# Patient Record
Sex: Female | Born: 1995 | Race: Black or African American | Hispanic: No | Smoking: Never smoker
Health system: Southern US, Community
[De-identification: ages and names within clinical notes are randomized; demographics above are authoritative.]

## PROBLEM LIST (undated history)

## (undated) ENCOUNTER — Inpatient Hospital Stay (HOSPITAL_COMMUNITY): Payer: Self-pay

## (undated) ENCOUNTER — Emergency Department (HOSPITAL_COMMUNITY): Admission: EM

## (undated) DIAGNOSIS — F32A Depression, unspecified: Secondary | ICD-10-CM

## (undated) DIAGNOSIS — F419 Anxiety disorder, unspecified: Secondary | ICD-10-CM

## (undated) DIAGNOSIS — Z8619 Personal history of other infectious and parasitic diseases: Secondary | ICD-10-CM

## (undated) DIAGNOSIS — Z789 Other specified health status: Secondary | ICD-10-CM

## (undated) DIAGNOSIS — D219 Benign neoplasm of connective and other soft tissue, unspecified: Secondary | ICD-10-CM

## (undated) HISTORY — DX: Personal history of other infectious and parasitic diseases: Z86.19

## (undated) HISTORY — PX: WISDOM TOOTH EXTRACTION: SHX21

## (undated) HISTORY — PX: TONSILLECTOMY: SUR1361

---
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
## 2013-02-27 IMAGING — CT CT ABD-PELV W/ CM
2 of 4 series · 17 of 46 positions shown, 19 images · IV contrast (agent unspecified)
Comparison: None.

CLINICAL DATA: Epigastric pain.  Nausea and diarrhea.  Fever.
Dysuria.

CT ABDOMEN AND PELVIS WITH CONTRAST
TECHNIQUE: Multidetector CT imaging of the abdomen and pelvis was
performed following the standard protocol during bolus
administration of intravenous contrast.
Contrast:  120 ml 3mnipaque-ZAA

[Series 2: abd/pelv with 5.0 b31f st · axial · 0.91mm/px · z∈[-451,+9]mm · 14 of 102 slices shown, 16 images]
[im 5/102  soft-tissue]
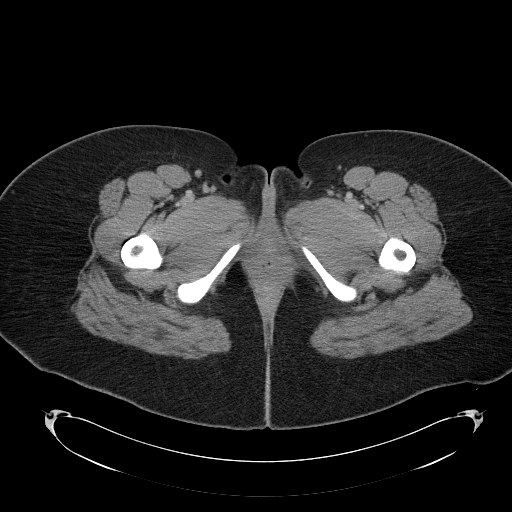
[im 5/102  bone]
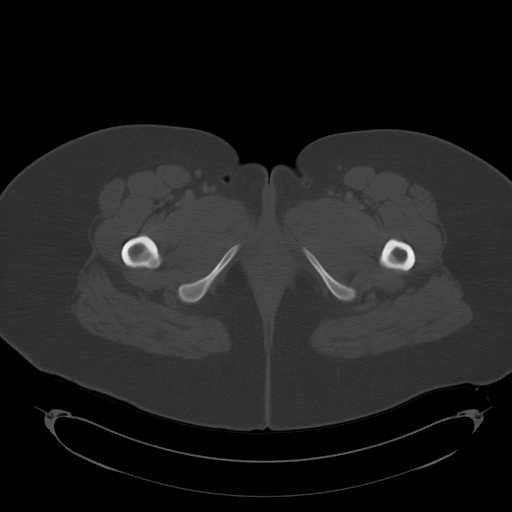
[im 13/102  soft-tissue]
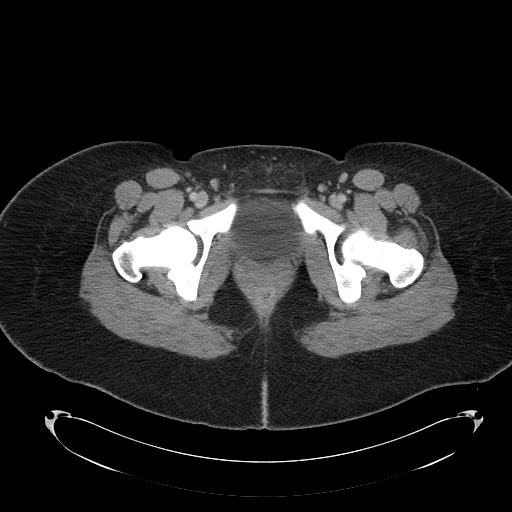
[im 22/102  soft-tissue]
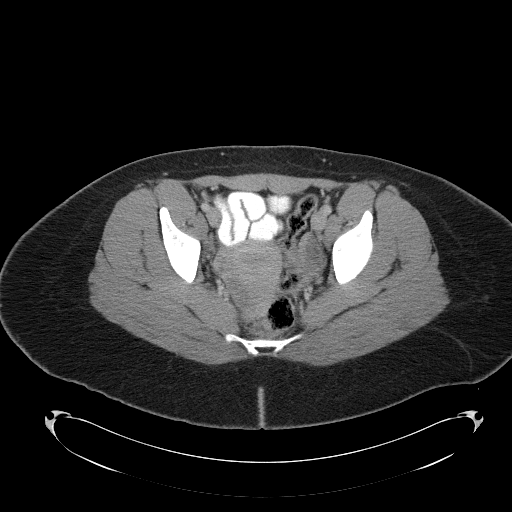
[im 26/102  soft-tissue]
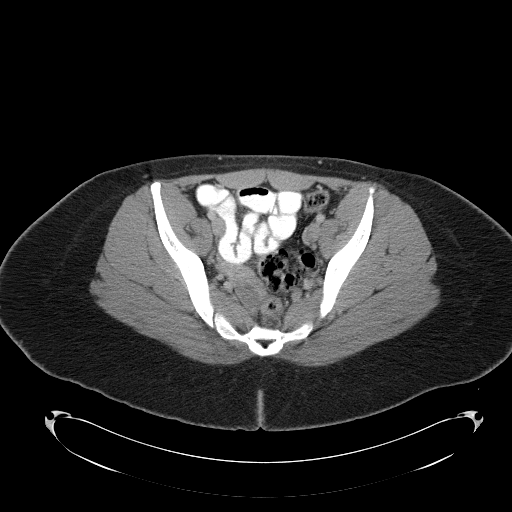
[im 34/102  soft-tissue]
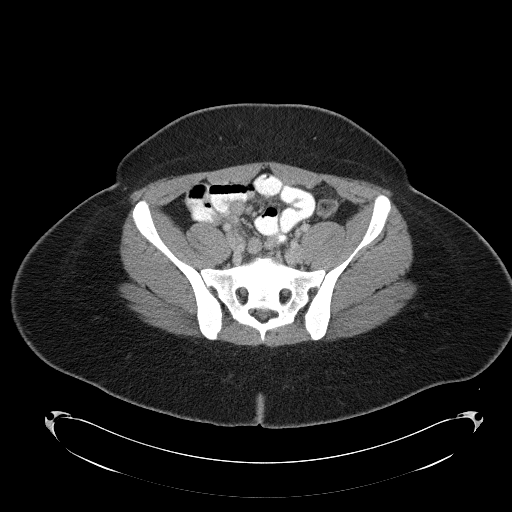
[im 43/102  soft-tissue]
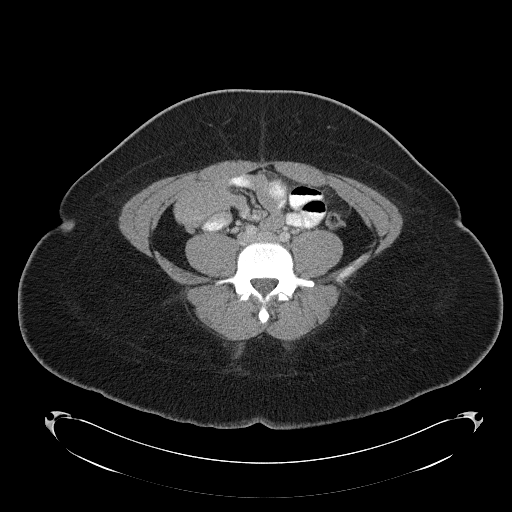
[im 47/102  soft-tissue]
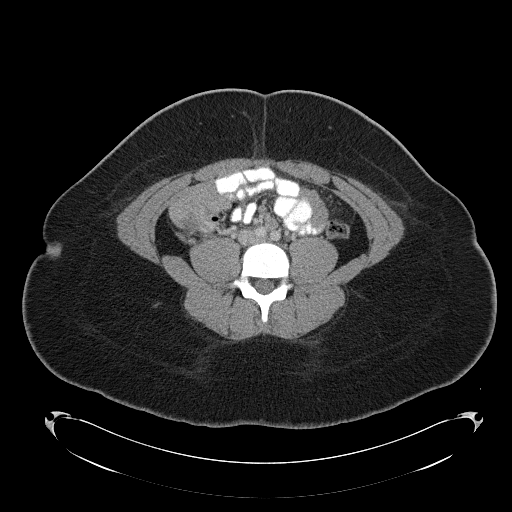
[im 55/102  soft-tissue]
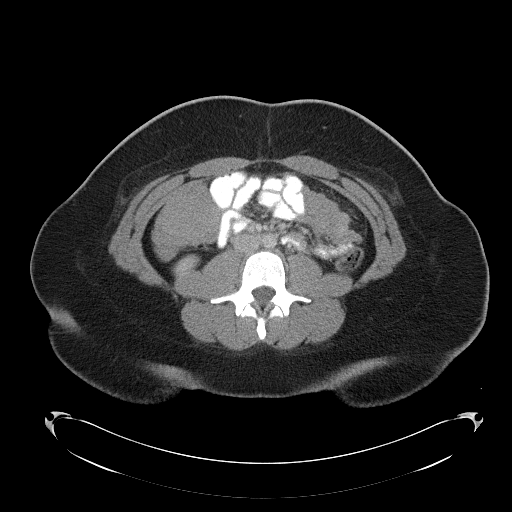
[im 59/102  soft-tissue]
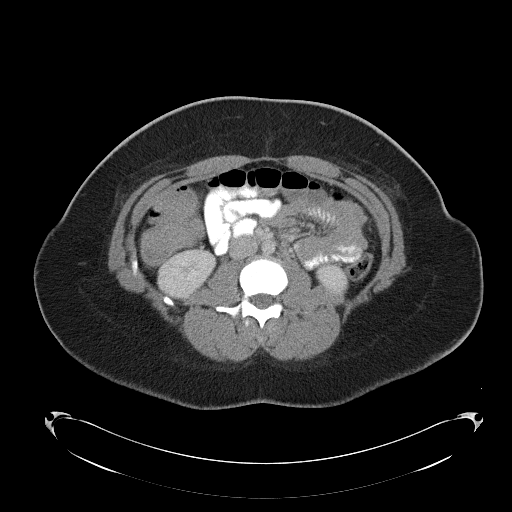
[im 59/102  bone]
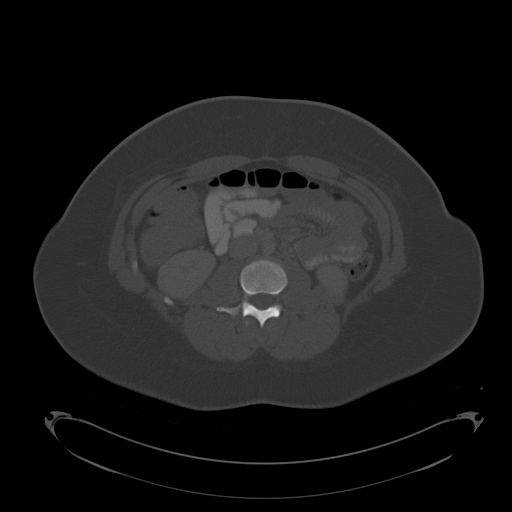
[im 68/102  soft-tissue]
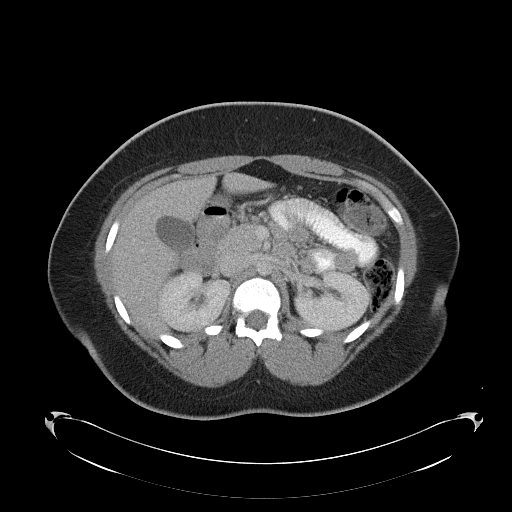
[im 76/102  soft-tissue]
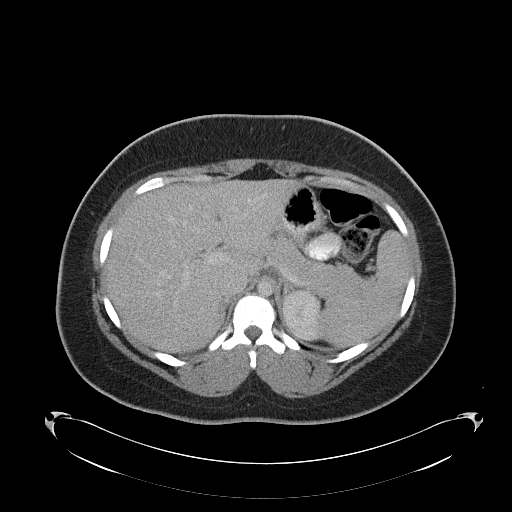
[im 80/102  soft-tissue]
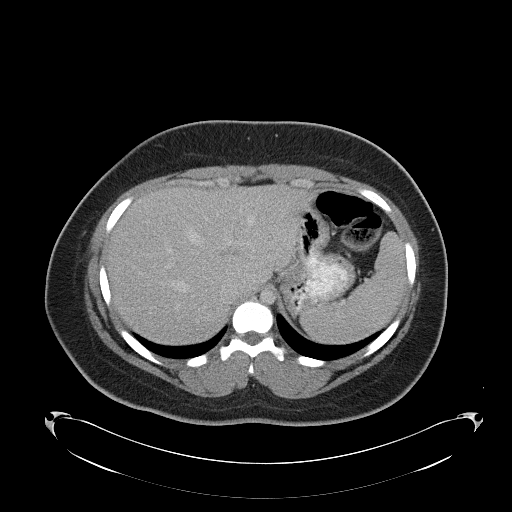
[im 89/102  soft-tissue]
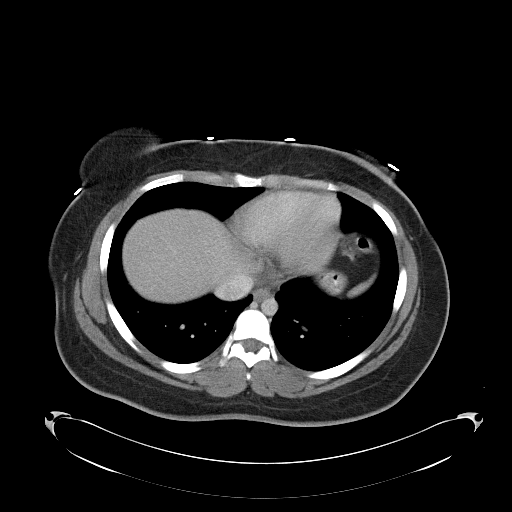
[im 97/102  soft-tissue]
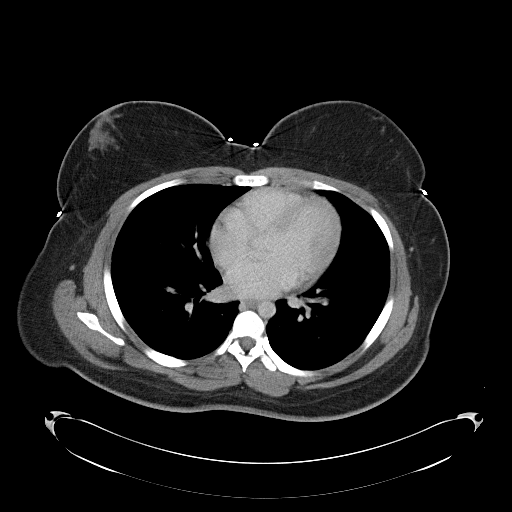

[Series 5: coronals · coronal · 0.99mm/px · 3 of 104 slices shown]
[im 35/104  soft-tissue]
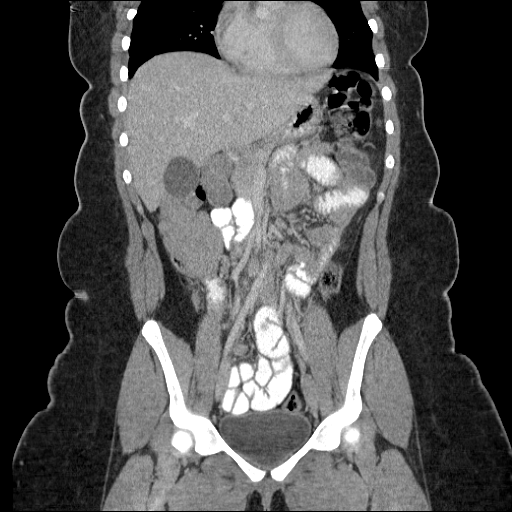
[im 46/104  soft-tissue]
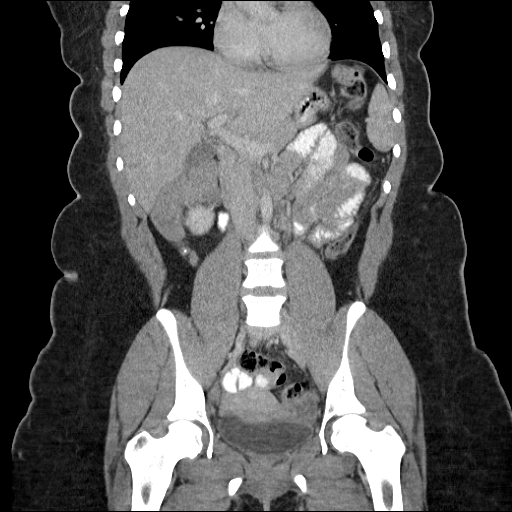
[im 58/104  soft-tissue]
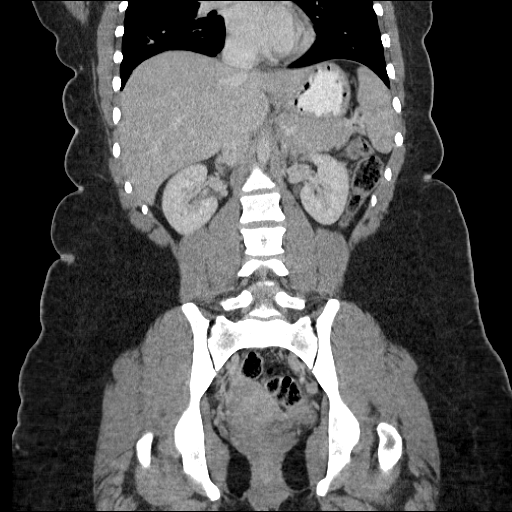

[17 of 46 positions shown; findings below may reference images not displayed]

FINDINGS: The liver, spleen, pancreas, and adrenal glands appear
unremarkable.

The gallbladder and biliary system appear unremarkable.

There is loops of bowel are not opacified by oral contrast and thus
difficult to assess.

A tiny appendicolith is present in the appendix, without compelling
findings of the appendiceal inflammation, and with appendiceal
diameter up to 0.7 cm.  Small scattered pericecal lymph nodes are
present.

The kidneys appear unremarkable, as do the proximal ureters.

No urinary bladder wall thickening is observed. The uterus and
adnexa appear unremarkable.

No free pelvic fluid identified.
IMPRESSION: 1.  Tiny appendicolith, but without compelling findings of acute
appendicitis.
2.  No dilated bowel noted.  No additional significant CT
abnormalities are observed.

## 2013-06-16 ENCOUNTER — Ambulatory Visit: Payer: Medicaid Other | Admitting: Obstetrics

## 2013-09-22 ENCOUNTER — Ambulatory Visit: Payer: Medicaid Other | Admitting: Obstetrics

## 2013-09-23 ENCOUNTER — Ambulatory Visit: Payer: Medicaid Other | Admitting: Obstetrics

## 2013-10-22 ENCOUNTER — Encounter: Payer: Self-pay | Admitting: Obstetrics

## 2013-10-22 ENCOUNTER — Ambulatory Visit (INDEPENDENT_AMBULATORY_CARE_PROVIDER_SITE_OTHER): Payer: Medicaid Other | Admitting: Obstetrics

## 2013-10-22 ENCOUNTER — Other Ambulatory Visit: Payer: Self-pay | Admitting: Obstetrics

## 2013-10-22 VITALS — BP 128/87 | HR 90 | Temp 98.3°F | Ht 64.0 in | Wt 270.0 lb

## 2013-10-22 DIAGNOSIS — Z0000 Encounter for general adult medical examination without abnormal findings: Secondary | ICD-10-CM

## 2013-10-22 DIAGNOSIS — Z0289 Encounter for other administrative examinations: Secondary | ICD-10-CM

## 2013-10-22 NOTE — Progress Notes (Signed)
Subjective:     Angela Hammond is a 18 y.o. female here for a routine exam.  Current complaints: none.    Personal health questionnaire:  Is patient Ashkenazi Jewish, have a family history of breast and/or ovarian cancer: no Is there a family history of uterine cancer diagnosed at age < 82, gastrointestinal cancer, urinary tract cancer, family member who is a Personnel officer syndrome-associated carrier: no Is the patient overweight and hypertensive, family history of diabetes, personal history of gestational diabetes or PCOS: no Is patient over 106, have PCOS,  family history of premature CHD under age 69, diabetes, smoke, have hypertension or peripheral artery disease:  no At any time, has a partner hit, kicked or otherwise hurt or frightened you?: no Over the past 2 weeks, have you felt down, depressed or hopeless?: no Over the past 2 weeks, have you felt little interest or pleasure in doing things?:no   Gynecologic History Patient's last menstrual period was 10/10/2013. Contraception: condoms Last Pap: 2010. Results were: normal Last mammogram: n/a. Results were: n/a  Obstetric History OB History  Gravida Para Term Preterm AB SAB TAB Ectopic Multiple Living  0 0 0 0 0 0 0 0 0 0         Past Medical History  Diagnosis Date  . History of gonorrhea     2013    Past Surgical History  Procedure Laterality Date  . Tonsillectomy      AGE 88    Current outpatient prescriptions:guaiFENesin (ROBITUSSIN) 100 MG/5ML liquid, Take 200 mg by mouth daily as needed for cough., Disp: , Rfl: ;  ibuprofen (ADVIL,MOTRIN) 200 MG tablet, Take 400 mg by mouth every 6 (six) hours as needed. For pain, Disp: , Rfl: ;  phenol (CHLORASEPTIC) 1.4 % LIQD, Use as directed 2 sprays in the mouth or throat daily as needed (for sore throat)., Disp: , Rfl:  No Known Allergies  History  Substance Use Topics  . Smoking status: Current Some Day Smoker  . Smokeless tobacco: Never Used  . Alcohol Use: Yes     Comment:  occasionally     Family History  Problem Relation Age of Onset  . Stroke Mother   . Hypertension Mother   . Hypertension Father   . Diabetes Maternal Aunt   . Stroke Maternal Aunt   . Hypertension Maternal Aunt   . Stroke Maternal Grandfather   . Hypertension Maternal Grandfather       Review of Systems  Constitutional: negative for fatigue and weight loss Respiratory: negative for cough and wheezing Cardiovascular: negative for chest pain, fatigue and palpitations Gastrointestinal: negative for abdominal pain and change in bowel habits Musculoskeletal:negative for myalgias Neurological: negative for gait problems and tremors Behavioral/Psych: negative for abusive relationship, depression Endocrine: negative for temperature intolerance   Genitourinary:negative for abnormal menstrual periods, genital lesions, hot flashes, sexual problems and vaginal discharge Integument/breast: negative for breast lump, breast tenderness, nipple discharge and skin lesion(s)    Objective:       BP 128/87  Pulse 90  Temp(Src) 98.3 F (36.8 C)  Ht 5\' 4"  (1.626 m)  Wt 270 lb (122.471 kg)  BMI 46.32 kg/m2  LMP 10/10/2013 General:   alert  Skin:   no rash or abnormalities  Lungs:   clear to auscultation bilaterally  Heart:   regular rate and rhythm, S1, S2 normal, no murmur, click, rub or gallop  Breasts:   normal without suspicious masses, skin or nipple changes or axillary nodes  Abdomen:  normal  findings: no organomegaly, soft, non-tender and no hernia  Pelvis:  External genitalia: normal general appearance Urinary system: urethral meatus normal and bladder without fullness, nontender Vaginal: normal without tenderness, induration or masses Cervix: normal appearance Adnexa: normal bimanual exam Uterus: anteverted and non-tender, normal size   Lab Review Urine pregnancy test Labs reviewed yes Radiologic studies reviewed no    Assessment:    Healthy female exam.    Plan:     Education reviewed: low fat, low cholesterol diet, safe sex/STD prevention, self breast exams, smoking cessation and weight bearing exercise. Contraception: condoms. Follow up in: 1 year.   No orders of the defined types were placed in this encounter.   Orders Placed This Encounter  Procedures  . WET PREP BY MOLECULAR PROBE  . GC/Chlamydia Probe Amp  . HIV antibody (with reflex)

## 2013-10-23 LAB — WET PREP BY MOLECULAR PROBE
CANDIDA SPECIES: NEGATIVE
Gardnerella vaginalis: POSITIVE — AB
TRICHOMONAS VAG: NEGATIVE

## 2013-10-23 LAB — GC/CHLAMYDIA PROBE AMP
CT PROBE, AMP APTIMA: NEGATIVE
GC Probe RNA: NEGATIVE

## 2013-10-24 ENCOUNTER — Other Ambulatory Visit: Payer: Self-pay | Admitting: Obstetrics

## 2013-10-24 DIAGNOSIS — B9689 Other specified bacterial agents as the cause of diseases classified elsewhere: Secondary | ICD-10-CM

## 2013-10-24 DIAGNOSIS — N760 Acute vaginitis: Principal | ICD-10-CM

## 2013-10-24 MED ORDER — METRONIDAZOLE 500 MG PO TABS
500.0000 mg | ORAL_TABLET | Freq: Two times a day (BID) | ORAL | Status: DC
Start: 2013-10-24 — End: 2014-10-08

## 2013-10-30 ENCOUNTER — Telehealth: Payer: Self-pay | Admitting: *Deleted

## 2013-10-30 NOTE — Telephone Encounter (Signed)
Pt called for lab results. Return call to pt.  Pt made aware of lab results and Rx has already been sent to pharmacy.  Pt advised of factors that contribute to BV and explained medication.  Pt advised to contact office with any other conerns.

## 2013-12-23 ENCOUNTER — Telehealth: Payer: Self-pay | Admitting: *Deleted

## 2013-12-23 NOTE — Telephone Encounter (Signed)
Patient called regarding her cycle- did not leave any identifiers except her name/#. Patient states she has a normal 4 day cycle-but she has been bleeding for 14 days. 12/23/2013 3:21 LM on VM to CB

## 2013-12-30 NOTE — Telephone Encounter (Signed)
Call to patient- she was out of town at the time and she went to a hospital in [place]. Patient was given medication that seemed to help. Patient advised to call the office for appointment if her bleeding goes off schedule again.

## 2014-10-08 ENCOUNTER — Ambulatory Visit (INDEPENDENT_AMBULATORY_CARE_PROVIDER_SITE_OTHER): Payer: Medicaid Other | Admitting: Obstetrics

## 2014-10-08 VITALS — BP 113/79 | HR 84 | Temp 98.2°F | Wt 281.0 lb

## 2014-10-08 DIAGNOSIS — N898 Other specified noninflammatory disorders of vagina: Secondary | ICD-10-CM

## 2014-10-08 DIAGNOSIS — Z113 Encounter for screening for infections with a predominantly sexual mode of transmission: Secondary | ICD-10-CM

## 2014-10-08 LAB — CBC
HEMATOCRIT: 38.9 % (ref 36.0–46.0)
Hemoglobin: 12 g/dL (ref 12.0–15.0)
MCH: 23.9 pg — ABNORMAL LOW (ref 26.0–34.0)
MCHC: 30.8 g/dL (ref 30.0–36.0)
MCV: 77.5 fL — ABNORMAL LOW (ref 78.0–100.0)
MPV: 10.5 fL (ref 8.6–12.4)
PLATELETS: 246 10*3/uL (ref 150–400)
RBC: 5.02 MIL/uL (ref 3.87–5.11)
RDW: 17.6 % — AB (ref 11.5–15.5)
WBC: 5.4 10*3/uL (ref 4.0–10.5)

## 2014-10-08 LAB — COMPREHENSIVE METABOLIC PANEL
ALK PHOS: 62 U/L (ref 33–115)
ALT: 11 U/L (ref 6–29)
AST: 16 U/L (ref 10–30)
Albumin: 4 g/dL (ref 3.6–5.1)
BUN: 10 mg/dL (ref 7–25)
CALCIUM: 8.8 mg/dL (ref 8.6–10.2)
CO2: 24 mmol/L (ref 20–31)
Chloride: 105 mmol/L (ref 98–110)
Creat: 0.93 mg/dL (ref 0.50–1.10)
Glucose, Bld: 94 mg/dL (ref 65–99)
Potassium: 4 mmol/L (ref 3.5–5.3)
Sodium: 138 mmol/L (ref 135–146)
Total Bilirubin: 0.7 mg/dL (ref 0.2–1.2)
Total Protein: 7.3 g/dL (ref 6.1–8.1)

## 2014-10-08 LAB — CHOLESTEROL, TOTAL: CHOLESTEROL: 151 mg/dL (ref 125–170)

## 2014-10-08 LAB — HDL CHOLESTEROL: HDL: 33 mg/dL — AB (ref >=46)

## 2014-10-08 LAB — TRIGLYCERIDES: TRIGLYCERIDES: 102 mg/dL (ref <150)

## 2014-10-08 LAB — LDL CHOLESTEROL, DIRECT: Direct LDL: 112 mg/dL — ABNORMAL HIGH (ref <110)
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
## 2014-10-09 LAB — TSH: TSH: 1.774 u[IU]/mL (ref 0.350–4.500)

## 2014-10-09 LAB — HEPATITIS C ANTIBODY: HCV AB: NEGATIVE

## 2014-10-09 LAB — HEPATITIS B SURFACE ANTIGEN: Hepatitis B Surface Ag: NEGATIVE

## 2014-10-09 LAB — RPR

## 2014-10-09 LAB — HEMOGLOBIN A1C
HEMOGLOBIN A1C: 5.8 % — AB (ref <5.7)
MEAN PLASMA GLUCOSE: 120 mg/dL — AB (ref <117)

## 2014-10-09 LAB — HIV ANTIBODY (ROUTINE TESTING W REFLEX): HIV 1&2 Ab, 4th Generation: NONREACTIVE

## 2014-10-13 LAB — SURESWAB, VAGINOSIS/VAGINITIS PLUS
Atopobium vaginae: 7.5 Log (cells/mL)
C. GLABRATA, DNA: NOT DETECTED
C. TRACHOMATIS RNA, TMA: NOT DETECTED
C. albicans, DNA: NOT DETECTED
C. parapsilosis, DNA: NOT DETECTED
C. tropicalis, DNA: NOT DETECTED
LACTOBACILLUS SPECIES: NOT DETECTED Log (cells/mL)
MEGASPHAERA SPECIES: >8 Log (cells/mL)
N. gonorrhoeae RNA, TMA: NOT DETECTED
T. VAGINALIS RNA, QL TMA: NOT DETECTED

## 2014-10-14 ENCOUNTER — Other Ambulatory Visit: Payer: Self-pay | Admitting: Obstetrics

## 2014-10-14 DIAGNOSIS — N760 Acute vaginitis: Principal | ICD-10-CM

## 2014-10-14 DIAGNOSIS — B9689 Other specified bacterial agents as the cause of diseases classified elsewhere: Secondary | ICD-10-CM

## 2014-10-14 MED ORDER — METRONIDAZOLE 500 MG PO TABS: 500.0000 mg | ORAL_TABLET | Freq: Two times a day (BID) | ORAL | Status: DC

## 2014-10-15 ENCOUNTER — Other Ambulatory Visit: Payer: Self-pay | Admitting: *Deleted

## 2014-10-15 DIAGNOSIS — N760 Acute vaginitis: Principal | ICD-10-CM

## 2014-10-15 DIAGNOSIS — B9689 Other specified bacterial agents as the cause of diseases classified elsewhere: Secondary | ICD-10-CM

## 2014-10-15 MED ORDER — METRONIDAZOLE 500 MG PO TABS: 500.0000 mg | ORAL_TABLET | Freq: Two times a day (BID) | ORAL | Status: DC

## 2014-10-18 ENCOUNTER — Telehealth: Payer: Self-pay | Admitting: *Deleted

## 2014-10-18 NOTE — Telephone Encounter (Signed)
Patient states her medication was not covered by her insurance in [place]. 6:00 LM on VM to CB- can get medication when she gets back if she wants.

## 2014-10-19 ENCOUNTER — Other Ambulatory Visit: Payer: Self-pay | Admitting: *Deleted

## 2014-10-19 DIAGNOSIS — N760 Acute vaginitis: Principal | ICD-10-CM

## 2014-10-19 DIAGNOSIS — B9689 Other specified bacterial agents as the cause of diseases classified elsewhere: Secondary | ICD-10-CM

## 2014-10-19 MED ORDER — METRONIDAZOLE 500 MG PO TABS: 500.0000 mg | ORAL_TABLET | Freq: Two times a day (BID) | ORAL | Status: DC

## 2014-10-25 ENCOUNTER — Ambulatory Visit: Payer: Medicaid Other | Admitting: Obstetrics

## 2014-10-29 ENCOUNTER — Telehealth: Payer: Self-pay | Admitting: *Deleted

## 2014-10-29 MED ORDER — METRONIDAZOLE 0.75 % VA GEL: 1.0000 | Freq: Two times a day (BID) | VAGINAL | Status: DC

## 2014-10-29 NOTE — Telephone Encounter (Signed)
Patient contacted the office stating she is unable to take the medication prescribed for her. Patient states it is "too strong and taste nasty." Patient is requesting a different medication. Patient was given Metronidazole for a bacterial infection. Per Dr. Clearance CootsHarper patient can choose between Metrogel intravaginally BID for 5 days or Clindamycin 300 mg  By mouth every 8 hours # 21. Patient advised of recommendations and chose the Metrogel. Prescription sent to the pharmacy.

## 2014-12-14 ENCOUNTER — Ambulatory Visit: Payer: Medicaid Other | Admitting: Obstetrics

## 2014-12-15 ENCOUNTER — Ambulatory Visit: Payer: Medicaid Other | Admitting: Certified Nurse Midwife

## 2015-05-30 ENCOUNTER — Telehealth: Payer: Self-pay | Admitting: *Deleted

## 2015-05-30 NOTE — Telephone Encounter (Signed)
Patient has questions about her pH balance-  Discussed different ways to control pH balance. Patient discussed irregular cycles also. Will call office for appointment if needs.

## 2015-07-31 ENCOUNTER — Encounter (HOSPITAL_COMMUNITY): Payer: Self-pay

## 2015-07-31 ENCOUNTER — Emergency Department (HOSPITAL_COMMUNITY)
Admission: EM | Admit: 2015-07-31 | Discharge: 2015-07-31 | Disposition: A | Discharge status: Home / Self Care | Payer: Medicaid Other | Attending: Dermatology | Admitting: Dermatology

## 2015-07-31 DIAGNOSIS — Z5321 Procedure and treatment not carried out due to patient leaving prior to being seen by health care provider: Secondary | ICD-10-CM | POA: Insufficient documentation

## 2015-07-31 DIAGNOSIS — F17200 Nicotine dependence, unspecified, uncomplicated: Secondary | ICD-10-CM | POA: Insufficient documentation

## 2015-07-31 DIAGNOSIS — N926 Irregular menstruation, unspecified: Secondary | ICD-10-CM | POA: Insufficient documentation

## 2015-07-31 LAB — URINE MICROSCOPIC-ADD ON

## 2015-07-31 LAB — URINALYSIS, ROUTINE W REFLEX MICROSCOPIC
Bilirubin Urine: NEGATIVE
Glucose, UA: NEGATIVE mg/dL
Hgb urine dipstick: NEGATIVE
Ketones, ur: NEGATIVE mg/dL
Nitrite: NEGATIVE
PROTEIN: NEGATIVE mg/dL
SPECIFIC GRAVITY, URINE: 1.018 (ref 1.005–1.030)
pH: 6 (ref 5.0–8.0)

## 2015-07-31 NOTE — ED Notes (Signed)
Called x 3 (for room placement); no answer.  

## 2015-07-31 NOTE — ED Notes (Signed)
Patient here to be evaluated for irregular periods since April. States she has had some intermittent spotting. Denies abdominal pain on arrival

## 2015-08-01 ENCOUNTER — Encounter (HOSPITAL_COMMUNITY): Payer: Self-pay | Admitting: Emergency Medicine

## 2015-08-01 ENCOUNTER — Emergency Department (HOSPITAL_COMMUNITY)
Admission: EM | Admit: 2015-08-01 | Discharge: 2015-08-01 | Disposition: A | Discharge status: Home / Self Care | Payer: Self-pay | Attending: Dermatology | Admitting: Dermatology

## 2015-08-01 LAB — POC URINE PREG, ED
PREG TEST UR: NEGATIVE
Preg Test, Ur: NEGATIVE

## 2015-08-01 NOTE — ED Notes (Signed)
No answer in waiting room 

## 2015-08-01 NOTE — ED Notes (Signed)
Called pt x2 no answer

## 2015-08-01 NOTE — ED Notes (Signed)
No answer for vital sign recheck. 

## 2015-08-01 NOTE — ED Notes (Signed)
Pt reports last normal period was in April.  Reports intermittent spotting since then.  Denies pain.  Currently not spotting.

## 2015-11-12 ENCOUNTER — Encounter (HOSPITAL_COMMUNITY): Payer: Self-pay | Admitting: *Deleted

## 2015-11-12 ENCOUNTER — Emergency Department (HOSPITAL_COMMUNITY): Payer: Self-pay

## 2015-11-12 ENCOUNTER — Emergency Department (HOSPITAL_COMMUNITY)
Admission: EM | Admit: 2015-11-12 | Discharge: 2015-11-12 | Disposition: A | Discharge status: Home / Self Care | Payer: Self-pay | Attending: Emergency Medicine | Admitting: Emergency Medicine

## 2015-11-12 DIAGNOSIS — Y999 Unspecified external cause status: Secondary | ICD-10-CM | POA: Insufficient documentation

## 2015-11-12 DIAGNOSIS — Y9389 Activity, other specified: Secondary | ICD-10-CM | POA: Insufficient documentation

## 2015-11-12 DIAGNOSIS — W208XXA Other cause of strike by thrown, projected or falling object, initial encounter: Secondary | ICD-10-CM | POA: Insufficient documentation

## 2015-11-12 DIAGNOSIS — S60012A Contusion of left thumb without damage to nail, initial encounter: Secondary | ICD-10-CM | POA: Insufficient documentation

## 2015-11-12 DIAGNOSIS — Y929 Unspecified place or not applicable: Secondary | ICD-10-CM | POA: Insufficient documentation

## 2015-11-12 DIAGNOSIS — F17200 Nicotine dependence, unspecified, uncomplicated: Secondary | ICD-10-CM | POA: Insufficient documentation

## 2015-11-12 NOTE — Progress Notes (Signed)
Orthopedic Tech Progress Note Patient Details:  Gerhard MunchLeah C Leikam Dec 22, 1995 [id number]  Ortho Devices Type of Ortho Device: Thumb velcro splint Ortho Device/Splint Location: Lt Arm Ortho Device/Splint Interventions: Ordered, Application   Clois Dupesvery S Natayla Cadenhead 11/12/2015, 10:55 PM

## 2015-11-12 NOTE — ED Notes (Signed)
Patient left at this time with all belongings. 
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
## 2015-11-12 NOTE — ED Provider Notes (Signed)
[facility]-EMERGENCY DEPT Provider Note   CSN: [id number] Arrival date & time: 11/12/15  2101  By signing my name below, I, Rosario Adie, attest that this documentation has been prepared under the direction and in the presence of [facility], [place].  Electronically Signed: Rosario Adie, ED Scribe. 11/12/15. 9:30 PM.  History   Chief Complaint Chief Complaint  Patient presents with  . Finger Injury   The history is provided by the patient. No language interpreter was used.  Hand Pain  This is a new problem. The current episode started more than 1 week ago. The problem occurs constantly. Progression since onset: intermittent. Pertinent negatives include no chest pain, no abdominal pain, no headaches and no shortness of breath. Exacerbated by: movement and palpation. The symptoms are relieved by medications (Ibuprofen). Treatments tried: Ibuprofen. The treatment provided mild relief.   HPI Comments: Angela Hammond is a 20 y.o. female who presents to the Emergency Department complaining of moderate, intermittent left first digit pain onset approximately 7-8 days ago. Pt reports that she unloading a heavy metal object when it landed on her left thumb, sustaining her current pain. Pt notes associated intermittent swelling and improving bruising to the area since the incident as well. She has been taking Ibuprofen and applying ice to the area since the onset of her pain with mild relief of her current pain; however, notes that it always returns. Her pain to the area is exacerbated with movement of the distal portion of the digit and palpation of the area. Pt has not been evaluated for her injury prior to today. She denies numbness, weakness, wound involvement, or any other associated symptoms.   Past Medical History:  Diagnosis Date  . History of gonorrhea    2013   Patient Active Problem List   Diagnosis Date Noted  . Vaginitis and vulvovaginitis, unspecified 06/11/2012  . Gonorrhea  04/19/2011  . PID (acute pelvic inflammatory disease) 04/18/2011  . Inadequate social support 04/18/2011  . Obesity (BMI 35.0-39.9 without comorbidity) 04/18/2011  . High risk sexual behavior 04/18/2011  . Pelvic inflammatory disease (PID) 04/18/2011  . Morbid obesity (HCC) 04/18/2011   Past Surgical History:  Procedure Laterality Date  . TONSILLECTOMY     AGE 75   OB History    Gravida Para Term Preterm AB Living   0 0 0 0 0 0   SAB TAB Ectopic Multiple Live Births   0 0 0 0       Home Medications    Prior to Admission medications   Medication Sig Start Date End Date Taking? Authorizing Provider  guaiFENesin (ROBITUSSIN) 100 MG/5ML liquid Take 200 mg by mouth daily as needed for cough.    Historical Provider, MD  ibuprofen (ADVIL,MOTRIN) 200 MG tablet Take 400 mg by mouth every 6 (six) hours as needed. For pain    Historical Provider, MD  metroNIDAZOLE (FLAGYL) 500 MG tablet Take 1 tablet (500 mg total) by mouth 2 (two) times daily. 10/19/14   Brock Bad, MD  metroNIDAZOLE (METROGEL VAGINAL) 0.75 % vaginal gel Place 1 Applicatorful vaginally 2 (two) times daily. 10/29/14   Brock Bad, MD  phenol (CHLORASEPTIC) 1.4 % LIQD Use as directed 2 sprays in the mouth or throat daily as needed (for sore throat).    Historical Provider, MD   Family History Family History  Problem Relation Age of Onset  . Stroke Mother   . Hypertension Mother   . Hypertension Father   . Diabetes  Maternal Aunt   . Stroke Maternal Aunt   . Hypertension Maternal Aunt   . Stroke Maternal Grandfather   . Hypertension Maternal Grandfather    Social History Social History  Substance Use Topics  . Smoking status: Current Some Day Smoker  . Smokeless tobacco: Never Used  . Alcohol use Yes     Comment: occasionally    Allergies   Review of patient's allergies indicates no known allergies.  Review of Systems Review of Systems  Respiratory: Negative for shortness of breath.     Cardiovascular: Negative for chest pain.  Gastrointestinal: Negative for abdominal pain.  Musculoskeletal: Positive for arthralgias (left thumb) and myalgias (left thumb).  Skin: Positive for color change. Negative for wound.  Neurological: Negative for weakness, numbness and headaches.  All other systems reviewed and are negative.  Physical Exam Updated Vital Signs BP 113/74 (BP Location: Left Arm)   Pulse 83   Resp 18   Ht 5\' 4"  (1.626 m)   Wt (!) 138.5 kg   LMP 10/30/2015   SpO2 100%   BMI 52.40 kg/m   Physical Exam  Constitutional: She appears well-developed and well-nourished.  HENT:  Head: Normocephalic.  Eyes: Conjunctivae are normal.  Cardiovascular: Normal rate.   Pulmonary/Chest: Effort normal. No respiratory distress.  Abdominal: She exhibits no distension.  Musculoskeletal:  Radial pulses area 2+ Adequate circulation of the area. Finger to left thumb opposition is normal. TTP to the thenar aspect and at the left DIP joint. No subungual hematomas. Limited ROM of the thumb secondary to pain.   Neurological: She is alert.  Skin: Skin is warm and dry.  Psychiatric: She has a normal mood and affect. Her behavior is normal.  Nursing note and vitals reviewed.  ED Treatments / Results  DIAGNOSTIC STUDIES: Oxygen Saturation is 99% on RA, normal by my interpretation.   COORDINATION OF CARE: 9:27 PM-Discussed next steps with pt. Pt verbalized understanding and is agreeable with the plan.   Labs (all labs ordered are listed, but only abnormal results are displayed) Labs Reviewed - No data to display Radiology Dg Finger Thumb Left  Result Date: 11/12/2015 CLINICAL DATA:  Pain after trauma 2 weeks ago. EXAM: LEFT THUMB 2+V COMPARISON:  None. FINDINGS: There is no evidence of fracture or dislocation. There is no evidence of arthropathy or other focal bone abnormality. Soft tissues are unremarkable IMPRESSION: Negative. Electronically Signed   By: Gerome Samavid  Williams III  M.D   On: 11/12/2015 22:29    Procedures Procedures  Thumb spica Velcro splint applied.  Medications Ordered in ED Medications - No data to display  Initial Impression / Assessment and Plan / ED Course  I have reviewed the triage vital signs and the nursing notes.  Pertinent imaging results that were available during my care of the patient were reviewed by me and considered in my medical decision making (see chart for details).  Clinical Course   Patient X-Ray negative for obvious fracture or dislocation. Discussed with the patient possibility of ligament injury and referral to hand surgeon given.  Pt advised to follow up with orthopedics if symptoms persist for possibility of missed fracture diagnosis. Patient given brace while in ED, conservative therapy recommended and discussed. Patient will be dc home & is agreeable with above plan. Pt is comfortable with above plan and is stable for discharge at this time. All questions were answered prior to disposition. Strict return precautions for f/u into the ED were discussed.   Final Clinical Impressions(s) /  ED Diagnoses   Final diagnoses:  Contusion of left thumb without damage to nail, initial encounter   New Prescriptions Discharge Medication List as of 11/12/2015 10:48 PM     I personally performed the services described in this documentation, which was scribed in my presence. The recorded information has been reviewed and is accurate.     [address]Lizvette Lightsey Crystal Lake[place] Janiel Crisostomo, [place]NP 11/13/15 1558    Nelva Nayobert Beaton, MD 11/13/15 2026

## 2016-01-15 ENCOUNTER — Emergency Department (HOSPITAL_COMMUNITY): Payer: Self-pay

## 2016-01-15 ENCOUNTER — Emergency Department (HOSPITAL_COMMUNITY)
Admission: EM | Admit: 2016-01-15 | Discharge: 2016-01-15 | Disposition: A | Discharge status: Home / Self Care | Payer: Self-pay | Attending: Emergency Medicine | Admitting: Emergency Medicine

## 2016-01-15 DIAGNOSIS — Y999 Unspecified external cause status: Secondary | ICD-10-CM | POA: Insufficient documentation

## 2016-01-15 DIAGNOSIS — S1093XA Contusion of unspecified part of neck, initial encounter: Secondary | ICD-10-CM | POA: Insufficient documentation

## 2016-01-15 DIAGNOSIS — F17200 Nicotine dependence, unspecified, uncomplicated: Secondary | ICD-10-CM | POA: Insufficient documentation

## 2016-01-15 DIAGNOSIS — Y929 Unspecified place or not applicable: Secondary | ICD-10-CM | POA: Insufficient documentation

## 2016-01-15 DIAGNOSIS — Z79899 Other long term (current) drug therapy: Secondary | ICD-10-CM | POA: Insufficient documentation

## 2016-01-15 DIAGNOSIS — Y939 Activity, unspecified: Secondary | ICD-10-CM | POA: Insufficient documentation

## 2016-01-15 MED ORDER — IBUPROFEN 800 MG PO TABS: 800.0000 mg | ORAL_TABLET | Freq: Three times a day (TID) | ORAL | 0 refills | Status: DC | PRN

## 2016-01-15 MED ORDER — IBUPROFEN 800 MG PO TABS
800.0000 mg | ORAL_TABLET | Freq: Once | ORAL | Status: AC
  Administered 2016-01-15: 800 mg via ORAL
  Filled 2016-01-15: qty 1
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
## 2016-01-15 NOTE — Discharge Instructions (Signed)
Return here as needed.  Use ice and heat on your neck

## 2017-08-06 ENCOUNTER — Emergency Department (HOSPITAL_COMMUNITY)
Admission: EM | Admit: 2017-08-06 | Discharge: 2017-08-06 | Disposition: A | Discharge status: Home / Self Care | Payer: Self-pay | Attending: Emergency Medicine | Admitting: Emergency Medicine

## 2017-08-06 ENCOUNTER — Other Ambulatory Visit: Payer: Self-pay

## 2017-08-06 ENCOUNTER — Encounter (HOSPITAL_COMMUNITY): Payer: Self-pay | Admitting: Emergency Medicine

## 2017-08-06 DIAGNOSIS — N760 Acute vaginitis: Secondary | ICD-10-CM | POA: Insufficient documentation

## 2017-08-06 DIAGNOSIS — B9689 Other specified bacterial agents as the cause of diseases classified elsewhere: Secondary | ICD-10-CM | POA: Insufficient documentation

## 2017-08-06 DIAGNOSIS — N739 Female pelvic inflammatory disease, unspecified: Secondary | ICD-10-CM | POA: Insufficient documentation

## 2017-08-06 LAB — I-STAT CHEM 8, ED
BUN: 11 mg/dL (ref 6–20)
Calcium, Ion: 1.21 mmol/L (ref 1.15–1.40)
Chloride: 104 mmol/L (ref 98–111)
Creatinine, Ser: 0.9 mg/dL (ref 0.44–1.00)
Glucose, Bld: 87 mg/dL (ref 70–99)
HCT: 41 % (ref 36.0–46.0)
Hemoglobin: 13.9 g/dL (ref 12.0–15.0)
Potassium: 3.6 mmol/L (ref 3.5–5.1)
Sodium: 143 mmol/L (ref 135–145)
TCO2: 29 mmol/L (ref 22–32)

## 2017-08-06 LAB — I-STAT BETA HCG BLOOD, ED (MC, WL, AP ONLY)

## 2017-08-06 LAB — BASIC METABOLIC PANEL
Anion gap: 6 (ref 5–15)
BUN: 10 mg/dL (ref 6–20)
CHLORIDE: 109 mmol/L (ref 98–111)
CO2: 26 mmol/L (ref 22–32)
CREATININE: 0.94 mg/dL (ref 0.44–1.00)
Calcium: 9.1 mg/dL (ref 8.9–10.3)
GFR calc Af Amer: >60 mL/min (ref >60)
GFR calc non Af Amer: >60 mL/min (ref >60)
GLUCOSE: 87 mg/dL (ref 70–99)
Potassium: 3.9 mmol/L (ref 3.5–5.1)
SODIUM: 141 mmol/L (ref 135–145)

## 2017-08-06 LAB — CBC WITH DIFFERENTIAL/PLATELET
ABS IMMATURE GRANULOCYTES: 0 10*3/uL (ref 0.0–0.1)
BASOS PCT: 0 %
Basophils Absolute: 0 10*3/uL (ref 0.0–0.1)
EOS PCT: 1 %
Eosinophils Absolute: 0.1 10*3/uL (ref 0.0–0.7)
HCT: 39.7 % (ref 36.0–46.0)
HEMOGLOBIN: 11.9 g/dL — AB (ref 12.0–15.0)
Immature Granulocytes: 0 %
Lymphocytes Relative: 45 %
Lymphs Abs: 2.4 10*3/uL (ref 0.7–4.0)
MCH: 24.4 pg — AB (ref 26.0–34.0)
MCHC: 30 g/dL (ref 30.0–36.0)
MCV: 81.5 fL (ref 78.0–100.0)
MONO ABS: 0.6 10*3/uL (ref 0.1–1.0)
MONOS PCT: 11 %
Neutro Abs: 2.4 10*3/uL (ref 1.7–7.7)
Neutrophils Relative %: 43 %
PLATELETS: 204 10*3/uL (ref 150–400)
RBC: 4.87 MIL/uL (ref 3.87–5.11)
RDW: 16.4 % — ABNORMAL HIGH (ref 11.5–15.5)
WBC: 5.4 10*3/uL (ref 4.0–10.5)

## 2017-08-06 LAB — WET PREP, GENITAL
Sperm: NONE SEEN
Trich, Wet Prep: NONE SEEN
YEAST WET PREP: NONE SEEN

## 2017-08-06 MED ORDER — LIDOCAINE HCL (PF) 1 % IJ SOLN
INTRAMUSCULAR | Status: AC
  Filled 2017-08-06: qty 5

## 2017-08-06 MED ORDER — AZITHROMYCIN 250 MG PO TABS
1000.0000 mg | ORAL_TABLET | Freq: Once | ORAL | Status: AC
  Administered 2017-08-06: 1000 mg via ORAL
  Filled 2017-08-06: qty 4

## 2017-08-06 MED ORDER — DOXYCYCLINE HYCLATE 100 MG PO CAPS: 100.0000 mg | ORAL_CAPSULE | Freq: Two times a day (BID) | ORAL | 0 refills | Status: AC

## 2017-08-06 MED ORDER — METRONIDAZOLE 500 MG PO TABS: 500.0000 mg | ORAL_TABLET | Freq: Two times a day (BID) | ORAL | 0 refills | Status: DC

## 2017-08-06 MED ORDER — CEFTRIAXONE SODIUM 250 MG IJ SOLR
250.0000 mg | Freq: Once | INTRAMUSCULAR | Status: AC
  Administered 2017-08-06: 250 mg via INTRAMUSCULAR
  Filled 2017-08-06: qty 250

## 2017-08-06 NOTE — ED Provider Notes (Signed)
[facility] EMERGENCY DEPARTMENT Provider Note   CSN: [id number] Arrival date & time: 08/06/17  1645     History   Chief Complaint Chief Complaint  Patient presents with  . Vaginal Bleeding    HPI Angela Hammond is a 22 y.o. female presenting for 2 weeks of vaginal bleeding.  Patient states that for the past 2 weeks she has been having to use 3-4 pads a day initially however she states that her bleeding has changed to more of a yellow/brown discharge from her vagina over the past 3 to 4 days.  Patient also endorses lower abdominal cramping, 5/10 in severity.  Patient states that the pain comes on randomly, patient states that nothing aggravates her pain. She has been treating with ibuprofen with relief. States that she has had one new sexual partner in the past week.  Patient denies fevers, dysuria, blood in her urine or stool.  Patient states that she does not take birth control.  Patient denies lightheadedness or headaches or dizziness.   Patient states that at this time she does not have an OB/GYN due to insurance.  HPI  Past Medical History:  Diagnosis Date  . History of gonorrhea    2013    Patient Active Problem List   Diagnosis Date Noted  . Vaginitis and vulvovaginitis, unspecified 06/11/2012  . Gonorrhea 04/19/2011  . PID (acute pelvic inflammatory disease) 04/18/2011  . Inadequate social support 04/18/2011  . Obesity (BMI 35.0-39.9 without comorbidity) 04/18/2011  . High risk sexual behavior 04/18/2011  . Pelvic inflammatory disease (PID) 04/18/2011  . Morbid obesity (HCC) 04/18/2011    Past Surgical History:  Procedure Laterality Date  . TONSILLECTOMY     AGE 33     OB History    Gravida  0   Para  0   Term  0   Preterm  0   AB  0   Living  0     SAB  0   TAB  0   Ectopic  0   Multiple  0   Live Births               Home Medications    Prior to Admission medications   Medication Sig Start Date End Date  Taking? Authorizing Provider  doxycycline (VIBRAMYCIN) 100 MG capsule Take 1 capsule (100 mg total) by mouth 2 (two) times daily for 14 days. 08/06/17 08/20/17  Harlene Salts A, PA-C  ibuprofen (ADVIL,MOTRIN) 800 MG tablet Take 1 tablet (800 mg total) by mouth every 8 (eight) hours as needed. Patient not taking: Reported on 08/06/2017 01/15/16   Charlestine Night, PA-C  metroNIDAZOLE (FLAGYL) 500 MG tablet Take 1 tablet (500 mg total) by mouth 2 (two) times daily. 08/06/17   Harlene Salts A, PA-C  metroNIDAZOLE (METROGEL VAGINAL) 0.75 % vaginal gel Place 1 Applicatorful vaginally 2 (two) times daily. Patient not taking: Reported on 08/06/2017 10/29/14   Brock Bad, MD    Family History Family History  Problem Relation Age of Onset  . Stroke Mother   . Hypertension Mother   . Hypertension Father   . Diabetes Maternal Aunt   . Stroke Maternal Aunt   . Hypertension Maternal Aunt   . Stroke Maternal Grandfather   . Hypertension Maternal Grandfather     Social History Social History   Tobacco Use  . Smoking status: Never Smoker  . Smokeless tobacco: Never Used  Substance Use Topics  . Alcohol use: Not Currently  Frequency: Never    Comment: occasionally   . Drug use: No     Allergies   Patient has no known allergies.   Review of Systems Review of Systems  Constitutional: Negative.  Negative for chills, fatigue and fever.  HENT: Negative.  Negative for sore throat and trouble swallowing.   Eyes: Negative.  Negative for visual disturbance.  Respiratory: Negative.  Negative for cough, chest tightness and shortness of breath.   Cardiovascular: Negative.  Negative for chest pain and leg swelling.  Gastrointestinal: Negative.  Negative for abdominal pain, blood in stool, diarrhea, nausea and vomiting.  Genitourinary: Positive for menstrual problem and vaginal bleeding. Negative for difficulty urinating, dysuria, flank pain, hematuria, vaginal discharge and vaginal  pain.  Musculoskeletal: Negative.  Negative for arthralgias, myalgias and neck pain.  Skin: Negative.  Negative for rash.  Neurological: Negative.  Negative for dizziness, syncope, weakness, light-headedness and headaches.     Physical Exam Updated Vital Signs BP 127/71   Pulse 70   Temp 98.2 F (36.8 C) (Oral)   Resp 18   Ht 5\' 4"  (1.626 m)   Wt 127 kg (280 lb)   LMP 07/26/2017   SpO2 100%   BMI 48.06 kg/m   Physical Exam  Constitutional: She is oriented to person, place, and time. She appears well-developed and well-nourished. No distress.  HENT:  Head: Normocephalic and atraumatic.  Right Ear: External ear normal.  Left Ear: External ear normal.  Nose: Nose normal.  Mouth/Throat: Oropharynx is clear and moist.  Eyes: Pupils are equal, round, and reactive to light. EOM are normal.  Neck: Normal range of motion. Neck supple. No JVD present. No tracheal deviation present.  Cardiovascular: Normal rate, regular rhythm and normal heart sounds.  Pulmonary/Chest: Effort normal and breath sounds normal. No respiratory distress.  Abdominal: Soft. Bowel sounds are normal. There is no tenderness. There is no rigidity, no rebound, no guarding, no CVA tenderness, no tenderness at McBurney's point and negative Murphy's sign.  No tenderness to palpation of the entire abdomen.  Patient denying pain at time of examination.  Genitourinary: Vagina normal and uterus normal. Pelvic exam was performed with patient supine. There is no rash, lesion or injury on the right labia. There is no rash, lesion or injury on the left labia. Cervix exhibits motion tenderness and discharge. Cervix exhibits no friability. No tenderness or bleeding in the vagina. No foreign body in the vagina. No signs of injury around the vagina.    Genitourinary Comments: Patient endorses mild tenderness to palpation during pelvic examination. There is moderate cervical motion tenderness. Exam chaperoned by Janeece Riggers.    Musculoskeletal: Normal range of motion.  Neurological: She is alert and oriented to person, place, and time. No sensory deficit.  Skin: Skin is warm and dry. Capillary refill takes less than 2 seconds.  Psychiatric: She has a normal mood and affect. Her behavior is normal.    ED Treatments / Results  Labs (all labs ordered are listed, but only abnormal results are displayed) Labs Reviewed  WET PREP, GENITAL - Abnormal; Notable for the following components:      Result Value   Clue Cells Wet Prep HPF POC PRESENT (*)    WBC, Wet Prep HPF POC MANY (*)    All other components within normal limits  CBC WITH DIFFERENTIAL/PLATELET - Abnormal; Notable for the following components:   Hemoglobin 11.9 (*)    MCH 24.4 (*)    RDW 16.4 (*)    All  other components within normal limits  BASIC METABOLIC PANEL  I-STAT BETA HCG BLOOD, ED ([facility], [facility], [facility] ONLY)  I-STAT CHEM 8, ED  GC/CHLAMYDIA PROBE AMP ([facility]) NOT AT [facility]    EKG None  Radiology No results found.  Procedures Procedures (including critical care time)  Medications Ordered in ED Medications  lidocaine (PF) (XYLOCAINE) 1 % injection (has no administration in time range)  cefTRIAXone (ROCEPHIN) injection 250 mg (250 mg Intramuscular Given 08/06/17 2227)  azithromycin (ZITHROMAX) tablet 1,000 mg (1,000 mg Oral Given 08/06/17 2226)     Initial Impression / Assessment and Plan / ED Course  I have reviewed the triage vital signs and the nursing notes.  Pertinent labs & imaging results that were available during my care of the patient were reviewed by me and considered in my medical decision making (see chart for details).  Clinical Course as of Aug 06 2240  Tue Aug 06, 2017  2159 Pelvic exam chaperoned by Sue Lush RN   [BM]  2159 Patient refused HIV test today.   [BM]    Clinical Course User Index [BM] Bill Salinas, PA-C   Patient presenting with chief complaint of vaginal bleeding, on exam it turns out the patient  has been describing purulent vaginal discharge.  She endorses having the yellow discharge on her pads for the past 3 days.  I am concerned for pelvic inflammatory disease based on purulent discharge from the cervical os as well as cervical motion tenderness and diffuse tenderness during pelvic exam. Patient is afebrile, not tachycardic, not hypotensive, denies pain at rest.  Patient tolerating p.o. fluids and solids well.  Denies nausea, vomiting, diarrhea, dysuria, blood in her urine or stool.  I have sent off GC chlamydia swab, patient be contacted by [facility] regarding these results.  Wet prep shows clue cells.  I have given the patient a prescription for Flagyl for treatment of bacterial vaginosis. I am treating the patient empirically for PID, she has received a 250 mg shot of Rocephin here in the department and 1 g of azithromycin p.o.  I have given the patient a prescription for a 14-day course of doxycycline. Pregnancy test negative today. Patient has refused HIV test today. Of note patient with history of PID, diagnosed in 2013.  I have strongly advised the patient to follow-up with her primary care doctor and have also given her a referral to women's health for follow-up.  Given the patient extensive return precautions and she states understanding of return precautions.  At this time there does not appear to be any evidence of an acute emergency medical condition and the patient appears stable for discharge with appropriate outpatient follow up. Diagnosis was discussed with patient who verbalizes understanding and is agreeable to discharge. I have discussed return precautions with patient who verbalize understanding of return precautions. Patient strongly encouraged to follow-up with their PCP.  Patient's case discussed with Dr. Adela Lank who agrees with plan to treat with antibiotics and discharge with follow-up.     This note was dictated using DragonOne dictation software; please contact for any  inconsistencies within the note.   Final Clinical Impressions(s) / ED Diagnoses   Final diagnoses:  Pelvic inflammatory disease (PID)  BV (bacterial vaginosis)    ED Discharge Orders        Ordered    doxycycline (VIBRAMYCIN) 100 MG capsule  2 times daily     08/06/17 2218    metroNIDAZOLE (FLAGYL) 500 MG tablet  2 times daily  08/06/17 2228       Bill SalinasMorelli, Anvitha Hutmacher A, PA-C 08/06/17 2249    Melene PlanFloyd, Dan, DO 08/07/17 1501

## 2017-08-06 NOTE — ED Notes (Signed)
Light green and lavender tubes collected with the istat.Sent to lab

## 2017-08-06 NOTE — ED Triage Notes (Signed)
Pt presents with vaginal bleeding since July 12th; periods are irregular but this one has lasted much longer than usual; states she sometimes have headaches, mood swings, cramping; pt just concerned why she has been bleeding so long

## 2017-08-06 NOTE — Discharge Instructions (Addendum)
Please follow-up with the women's health center as soon as possible regarding her visit today. Please follow-up with your primary care provider regarding your visit today, if you do not already have one you may call [facility] to try and establish a primary care provider. Please take all of your antibiotic medications as prescribed, doxycycline can cause sun sensitivity, be sure to wear sun protection while outside.  Do not drink alcohol while taking Flagyl, metronidazole, it will make you vomit. Please return to the emergency department for any new or worsening symptoms. You may use over-the-counter anti-inflammatory medication such as Tylenol or ibuprofen as needed for pain.  Please only use as directed on the bottle/package.  Contact a doctor if: You have more fluid (discharge) coming from your vagina or fluid that is not normal. Your pain does not improve. You throw up (vomit). You have a fever. You cannot take your medicines. Your partner has a sexually transmitted disease (STD). You have pain when you pee (urinate). Get help right away if: You have more belly (abdominal) or lower belly pain. You have chills. You are not better after 72 hours. Contact a doctor if: Your symptoms do not get better, even after you are treated. You have more discharge or pain when you pee (urinate). You have a fever. You have pain in your belly (abdomen). You have pain with sex. Your bleed from your vagina between periods.

## 2017-08-06 NOTE — ED Notes (Signed)
Pt has been called multiple times with no answer in lobby.  

## 2017-08-07 LAB — GC/CHLAMYDIA PROBE AMP (~~LOC~~) NOT AT ARMC
Chlamydia: NEGATIVE
Neisseria Gonorrhea: NEGATIVE

## 2017-08-07 MED FILL — metroNIDAZOLE 500 MG TABS: 500 | 7 days supply | Qty: 14 | Fill #0

## 2017-08-07 MED FILL — DOXYCYCLINE HYCLATE 100 MG: 100 | 7 days supply | Qty: 28 | Fill #0

## 2017-10-12 ENCOUNTER — Encounter (HOSPITAL_COMMUNITY): Payer: Self-pay | Admitting: Emergency Medicine

## 2017-10-12 ENCOUNTER — Emergency Department (HOSPITAL_COMMUNITY)
Admission: EM | Admit: 2017-10-12 | Discharge: 2017-10-12 | Disposition: A | Discharge status: Home / Self Care | Payer: Self-pay | Attending: Emergency Medicine | Admitting: Emergency Medicine

## 2017-10-12 DIAGNOSIS — J029 Acute pharyngitis, unspecified: Secondary | ICD-10-CM | POA: Insufficient documentation

## 2017-10-12 DIAGNOSIS — Z79899 Other long term (current) drug therapy: Secondary | ICD-10-CM | POA: Insufficient documentation

## 2017-10-12 LAB — GROUP A STREP BY PCR: Group A Strep by PCR: NOT DETECTED

## 2017-10-12 MED ORDER — FLUTICASONE PROPIONATE 50 MCG/ACT NA SUSP: 1.0000 | Freq: Every day | NASAL | 0 refills | Status: DC

## 2017-10-12 MED ORDER — CETIRIZINE-PSEUDOEPHEDRINE ER 5-120 MG PO TB12: 1.0000 | ORAL_TABLET | Freq: Every day | ORAL | 0 refills | Status: DC

## 2017-10-12 NOTE — ED Triage Notes (Signed)
Pt reports her throat has been sore since last week, also reports some body aches and dry cough.

## 2017-10-12 NOTE — ED Provider Notes (Signed)
[facility] EMERGENCY DEPARTMENT Provider Note   CSN: [id number] Arrival date & time: 10/12/17  8295     History   Chief Complaint Chief Complaint  Patient presents with  . Sore Throat    HPI Angela Hammond is a 22 y.o. female who presents with a one-week history of sore throat.  Patient has had intermittent sneezing.  She reports itchiness in her throat which makes her cough occasionally.  She is also noticed occasional mild shortness of breath with exertion since she has been sick, but has had nasal congestion.  She denies any fevers at home.  She denies any chest pain or persistent cough.  She has been taking cough drops at home.  She was around a family member with similar symptoms a few days prior to onset.  HPI  Past Medical History:  Diagnosis Date  . History of gonorrhea    2013    Patient Active Problem List   Diagnosis Date Noted  . Vaginitis and vulvovaginitis, unspecified 06/11/2012  . Gonorrhea 04/19/2011  . PID (acute pelvic inflammatory disease) 04/18/2011  . Inadequate social support 04/18/2011  . Obesity (BMI 35.0-39.9 without comorbidity) 04/18/2011  . High risk sexual behavior 04/18/2011  . Pelvic inflammatory disease (PID) 04/18/2011  . Morbid obesity (HCC) 04/18/2011    Past Surgical History:  Procedure Laterality Date  . TONSILLECTOMY     AGE 82     OB History    Gravida  0   Para  0   Term  0   Preterm  0   AB  0   Living  0     SAB  0   TAB  0   Ectopic  0   Multiple  0   Live Births               Home Medications    Prior to Admission medications   Medication Sig Start Date End Date Taking? Authorizing Provider  cetirizine-pseudoephedrine (ZYRTEC-D) 5-120 MG tablet Take 1 tablet by mouth daily. 10/12/17   Renel Ende, Waylan Boga, PA-C  fluticasone (FLONASE) 50 MCG/ACT nasal spray Place 1 spray into both nostrils daily. 10/12/17   Melissaann Dizdarevic, Waylan Boga, PA-C  ibuprofen (ADVIL,MOTRIN) 800 MG tablet Take 1  tablet (800 mg total) by mouth every 8 (eight) hours as needed. Patient not taking: Reported on 08/06/2017 01/15/16   Charlestine Night, PA-C  metroNIDAZOLE (FLAGYL) 500 MG tablet Take 1 tablet (500 mg total) by mouth 2 (two) times daily. 08/06/17   Harlene Salts A, PA-C  metroNIDAZOLE (METROGEL VAGINAL) 0.75 % vaginal gel Place 1 Applicatorful vaginally 2 (two) times daily. Patient not taking: Reported on 08/06/2017 10/29/14   Brock Bad, MD    Family History Family History  Problem Relation Age of Onset  . Stroke Mother   . Hypertension Mother   . Hypertension Father   . Diabetes Maternal Aunt   . Stroke Maternal Aunt   . Hypertension Maternal Aunt   . Stroke Maternal Grandfather   . Hypertension Maternal Grandfather     Social History Social History   Tobacco Use  . Smoking status: Never Smoker  . Smokeless tobacco: Never Used  Substance Use Topics  . Alcohol use: Not Currently    Frequency: Never    Comment: occasionally   . Drug use: No     Allergies   Patient has no known allergies.   Review of Systems Review of Systems  Constitutional: Negative for fever.  HENT:  Positive for congestion and sore throat. Negative for ear pain.   Respiratory: Positive for cough and shortness of breath (mild with nasal congestion).   Cardiovascular: Negative for chest pain.     Physical Exam Updated Vital Signs BP 121/77 (BP Location: Right Arm)   Pulse 77   Temp 98.5 F (36.9 C) (Oral)   Resp 18   SpO2 99%   Physical Exam  Constitutional: She appears well-developed and well-nourished. No distress.  HENT:  Head: Normocephalic and atraumatic.  Left Ear: Tympanic membrane normal.  Mouth/Throat: Posterior oropharyngeal erythema present. No oropharyngeal exudate, posterior oropharyngeal edema or tonsillar abscesses. Tonsils are 1+ on the right. Tonsils are 1+ on the left. No tonsillar exudate.  Eyes: Pupils are equal, round, and reactive to light. Conjunctivae are  normal. Right eye exhibits no discharge. Left eye exhibits no discharge. No scleral icterus.  Neck: Normal range of motion. Neck supple. No thyromegaly present.  Cardiovascular: Normal rate, regular rhythm, normal heart sounds and intact distal pulses. Exam reveals no gallop and no friction rub.  No murmur heard. Pulmonary/Chest: Effort normal and breath sounds normal. No stridor. No respiratory distress. She has no wheezes. She has no rales.  Abdominal: Soft. Bowel sounds are normal. She exhibits no distension. There is no tenderness. There is no rebound and no guarding.  Musculoskeletal: She exhibits no edema.  Lymphadenopathy:    She has no cervical adenopathy (some tenderness on the right, but no palpable lymphadenopathy).  Neurological: She is alert. Coordination normal.  Skin: Skin is warm and dry. No rash noted. She is not diaphoretic. No pallor.  Psychiatric: She has a normal mood and affect.  Nursing note and vitals reviewed.    ED Treatments / Results  Labs (all labs ordered are listed, but only abnormal results are displayed) Labs Reviewed  GROUP A STREP BY PCR    EKG None  Radiology No results found.  Procedures Procedures (including critical care time)  Medications Ordered in ED Medications - No data to display   Initial Impression / Assessment and Plan / ED Course  I have reviewed the triage vital signs and the nursing notes.  Pertinent labs & imaging results that were available during my care of the patient were reviewed by me and considered in my medical decision making (see chart for details).     Patient with suspected viral pharyngitis versus allergic symptoms.  Strep PCR is negative.  Will treat with Zyrtec-D Flonase.  Lungs are clear to auscultation.  No indication for chest x-ray at this time.  Patient has only had intermittent cough.  Return precautions discussed.  Patient understands and agrees with plan.  Patient vitals stable throughout ED course  and discharged in satisfactory condition.  Final Clinical Impressions(s) / ED Diagnoses   Final diagnoses:  Sore throat    ED Discharge Orders         Ordered    cetirizine-pseudoephedrine (ZYRTEC-D) 5-120 MG tablet  Daily     10/12/17 1048    fluticasone (FLONASE) 50 MCG/ACT nasal spray  Daily     10/12/17 [address], [place], PA-C 10/12/17 1049    Arby Barrette, MD 10/12/17 1345

## 2017-10-12 NOTE — ED Notes (Signed)
ED Provider at bedside. 

## 2017-10-12 NOTE — Discharge Instructions (Addendum)
Use Zyrtec-D and Flonase once daily as prescribed.  Alternate ibuprofen and Tylenol as prescribed over-the-counter, as needed for pain.  Drink tea with honey and lemon to help soothe your throat.  Gargle with warm salt water to soothe your throat as well.  Please return to emergency department if you develop any new or worsening symptoms including shortness of breath, persistent fever over 100.4, asymmetry in your throat, worsening productive cough, or any other concerning symptoms.

## 2018-03-16 ENCOUNTER — Other Ambulatory Visit: Payer: Self-pay

## 2018-03-16 ENCOUNTER — Emergency Department (HOSPITAL_COMMUNITY)
Admission: EM | Admit: 2018-03-16 | Discharge: 2018-03-17 | Disposition: A | Discharge status: Home / Self Care | Payer: Self-pay | Attending: Emergency Medicine | Admitting: Emergency Medicine

## 2018-03-16 ENCOUNTER — Encounter (HOSPITAL_COMMUNITY): Payer: Self-pay | Admitting: Emergency Medicine

## 2018-03-16 DIAGNOSIS — R110 Nausea: Secondary | ICD-10-CM | POA: Insufficient documentation

## 2018-03-16 DIAGNOSIS — Z79899 Other long term (current) drug therapy: Secondary | ICD-10-CM | POA: Insufficient documentation

## 2018-03-16 DIAGNOSIS — R197 Diarrhea, unspecified: Secondary | ICD-10-CM | POA: Insufficient documentation

## 2018-03-16 MED ORDER — SODIUM CHLORIDE 0.9% FLUSH: 3.0000 mL | Freq: Once | INTRAVENOUS | Status: DC

## 2018-03-16 MED ORDER — ONDANSETRON 4 MG PO TBDP
4.0000 mg | ORAL_TABLET | Freq: Once | ORAL | Status: AC
  Administered 2018-03-16: 4 mg via ORAL
  Filled 2018-03-16: qty 1

## 2018-03-16 MED ORDER — DICYCLOMINE HCL 10 MG PO CAPS
10.0000 mg | ORAL_CAPSULE | Freq: Once | ORAL | Status: AC
  Administered 2018-03-16: 10 mg via ORAL
  Filled 2018-03-16: qty 1

## 2018-03-16 NOTE — ED Triage Notes (Signed)
C/o nausea and diarrhea that started this morning.  Denies abd pain.

## 2018-03-17 MED ORDER — LOPERAMIDE HCL 2 MG PO CAPS: 2.0000 mg | ORAL_CAPSULE | Freq: Four times a day (QID) | ORAL | 0 refills | Status: DC | PRN

## 2018-03-17 MED ORDER — ONDANSETRON HCL 4 MG PO TABS: 4.0000 mg | ORAL_TABLET | Freq: Three times a day (TID) | ORAL | 0 refills | Status: DC | PRN

## 2018-03-17 MED ORDER — DICYCLOMINE HCL 20 MG PO TABS: 20.0000 mg | ORAL_TABLET | Freq: Two times a day (BID) | ORAL | 0 refills | Status: DC | PRN

## 2018-03-17 NOTE — Discharge Instructions (Addendum)
It is important you stay well-hydrated with water. You Zofran as needed for nausea or vomiting. Use Bentyl as needed for spasm, cramping, or abdominal pain. Use Imodium sparingly as needed for diarrhea. Be careful with a few choices of maximal days.  Avoid spicy, greasy, or acidic foods. Return to the emergency room if you develop high fevers, severe worsening abdominal pain, or any new, worsening, concerning symptoms.

## 2018-03-17 NOTE — ED Provider Notes (Signed)
[facility] EMERGENCY DEPARTMENT Provider Note   CSN: [id number] Arrival date & time: 03/16/18  2147    History   Chief Complaint Chief Complaint  Patient presents with  . Diarrhea  . Nausea    HPI Angela Hammond is a 23 y.o. female presenting for evaluation of nausea diarrhea.  Patient states that she has been having nausea and diarrhea for the past 8 hours.  This began after she took a stool softener.  She denies fevers, chills, chest pain, shortness of breath, vomiting, abdominal pain, urinary symptoms.  Patient took a stool softener because she had not had a bowel movement in 24 hours.  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  She denies recent travel.  She has no history of abd problems or surgeries.  She has not taken anything for her symptoms.  She has tolerated p.o. at home without worsening symptoms.  Patient states she has had 5-10 bowel movements since symptoms started, denies blood in her stool.  Patient reports irritation surrounding her rectum due to frequent bowel movements.     HPI  Past Medical History:  Diagnosis Date  . History of gonorrhea    2013    Patient Active Problem List   Diagnosis Date Noted  . Vaginitis and vulvovaginitis, unspecified 06/11/2012  . Gonorrhea 04/19/2011  . PID (acute pelvic inflammatory disease) 04/18/2011  . Inadequate social support 04/18/2011  . Obesity (BMI 35.0-39.9 without comorbidity) 04/18/2011  . High risk sexual behavior 04/18/2011  . Pelvic inflammatory disease (PID) 04/18/2011  . Morbid obesity (HCC) 04/18/2011    Past Surgical History:  Procedure Laterality Date  . TONSILLECTOMY     AGE 65     OB History    Gravida  0   Para  0   Term  0   Preterm  0   AB  0   Living  0     SAB  0   TAB  0   Ectopic  0   Multiple  0   Live Births               Home Medications    Prior to Admission medications   Medication Sig Start Date  End Date Taking? Authorizing Provider  cetirizine-pseudoephedrine (ZYRTEC-D) 5-120 MG tablet Take 1 tablet by mouth daily. 10/12/17   Law, Waylan Boga, PA-C  dicyclomine (BENTYL) 20 MG tablet Take 1 tablet (20 mg total) by mouth 2 (two) times daily as needed for spasms. 03/17/18   Shawntia Mangal, PA-C  fluticasone (FLONASE) 50 MCG/ACT nasal spray Place 1 spray into both nostrils daily. 10/12/17   Law, Waylan Boga, PA-C  ibuprofen (ADVIL,MOTRIN) 800 MG tablet Take 1 tablet (800 mg total) by mouth every 8 (eight) hours as needed. Patient not taking: Reported on 08/06/2017 01/15/16   Charlestine Night, PA-C  loperamide (IMODIUM) 2 MG capsule Take 1 capsule (2 mg total) by mouth 4 (four) times daily as needed for diarrhea or loose stools. 03/17/18   Alexiya Franqui, PA-C  metroNIDAZOLE (FLAGYL) 500 MG tablet Take 1 tablet (500 mg total) by mouth 2 (two) times daily. 08/06/17   Harlene Salts A, PA-C  metroNIDAZOLE (METROGEL VAGINAL) 0.75 % vaginal gel Place 1 Applicatorful vaginally 2 (two) times daily. Patient not taking: Reported on 08/06/2017 10/29/14   Brock Bad, MD  ondansetron (ZOFRAN) 4 MG tablet Take 1 tablet (4 mg total) by mouth every 8 (eight) hours as  needed for nausea or vomiting. 03/17/18   Murad Staples, PA-C    Family History Family History  Problem Relation Age of Onset  . Stroke Mother   . Hypertension Mother   . Hypertension Father   . Diabetes Maternal Aunt   . Stroke Maternal Aunt   . Hypertension Maternal Aunt   . Stroke Maternal Grandfather   . Hypertension Maternal Grandfather     Social History Social History   Tobacco Use  . Smoking status: Never Smoker  . Smokeless tobacco: Never Used  Substance Use Topics  . Alcohol use: Not Currently    Frequency: Never    Comment: occasionally   . Drug use: No     Allergies   Patient has no known allergies.   Review of Systems Review of Systems  Gastrointestinal: Positive for diarrhea and nausea.  All  other systems reviewed and are negative.    Physical Exam Updated Vital Signs BP (!) 161/103 (BP Location: Right Arm)   Pulse 97   Temp 98.2 F (36.8 C) (Oral)   Resp 18   LMP 02/16/2018   SpO2 100%   Physical Exam Vitals signs and nursing note reviewed.  Constitutional:      General: She is not in acute distress.    Appearance: She is well-developed.     Comments: Sitting comfortably in the bed in no acute distress  HENT:     Head: Normocephalic and atraumatic.     Mouth/Throat:     Mouth: Mucous membranes are moist.     Comments: MM moist Eyes:     Conjunctiva/sclera: Conjunctivae normal.     Pupils: Pupils are equal, round, and reactive to light.  Neck:     Musculoskeletal: Normal range of motion and neck supple.  Cardiovascular:     Rate and Rhythm: Normal rate and regular rhythm.     Pulses: Normal pulses.  Pulmonary:     Effort: Pulmonary effort is normal. No respiratory distress.     Breath sounds: Normal breath sounds. No wheezing.  Abdominal:     General: There is no distension.     Palpations: Abdomen is soft. There is no mass.     Tenderness: There is no abdominal tenderness. There is no guarding or rebound.     Comments: No tenderness palpation the abdomen.  Soft without rigidity, guarding, distention.  Negative rebound.  No signs of peritonitis.  Musculoskeletal: Normal range of motion.  Skin:    General: Skin is warm and dry.  Neurological:     Mental Status: She is alert and oriented to person, place, and time.      ED Treatments / Results  Labs (all labs ordered are listed, but only abnormal results are displayed) Labs Reviewed - No data to display  EKG None  Radiology No results found.  Procedures Procedures (including critical care time)  Medications Ordered in ED Medications  dicyclomine (BENTYL) capsule 10 mg (10 mg Oral Given 03/16/18 2331)  ondansetron (ZOFRAN-ODT) disintegrating tablet 4 mg (4 mg Oral Given 03/16/18 2330)      Initial Impression / Assessment and Plan / ED Course  I have reviewed the triage vital signs and the nursing notes.  Pertinent labs & imaging results that were available during my care of the patient were reviewed by me and considered in my medical decision making (see chart for details).        Patient presenting for evaluation of diarrhea which began after taking a stool softener.  Physical exam reassuring, she is afebrile not tachycardic.  Appears nontoxic.  Abdominal exam reassuring, no tenderness.  At this time, low suspicion for intra-abdominal infection, perforation, obstruction, or surgical abdomen.  I do not believe labs or CT would be necessary at this time.  Patient treated symptomatically with Zofran and Bentyl.  Encouraged continued hydration and careful food choices as diarrhea resolves.  Strict return precautions given, including fever, worsening pain, or signs of infection.  At this time, patient appears safe for discharge.  Return precautions given.  Patient states she understands and agrees to plan.   Final Clinical Impressions(s) / ED Diagnoses   Final diagnoses:  Nausea  Diarrhea, unspecified type    ED Discharge Orders         Ordered    ondansetron (ZOFRAN) 4 MG tablet  Every 8 hours PRN     03/17/18 0004    dicyclomine (BENTYL) 20 MG tablet  2 times daily PRN     03/17/18 0004    loperamide (IMODIUM) 2 MG capsule  4 times daily PRN     03/17/18 0004           March Joos, PA-C 03/17/18 0034    Palumbo, April, MD 03/17/18 5916

## 2019-01-27 ENCOUNTER — Encounter (HOSPITAL_COMMUNITY): Payer: Self-pay | Admitting: Emergency Medicine

## 2019-01-27 ENCOUNTER — Other Ambulatory Visit: Payer: Self-pay

## 2019-01-27 ENCOUNTER — Ambulatory Visit (HOSPITAL_COMMUNITY)
Admission: EM | Admit: 2019-01-27 | Discharge: 2019-01-27 | Disposition: A | Discharge status: Home / Self Care | Payer: Self-pay | Attending: Family Medicine | Admitting: Family Medicine

## 2019-01-27 DIAGNOSIS — Z3202 Encounter for pregnancy test, result negative: Secondary | ICD-10-CM

## 2019-01-27 DIAGNOSIS — R110 Nausea: Secondary | ICD-10-CM

## 2019-01-27 DIAGNOSIS — N939 Abnormal uterine and vaginal bleeding, unspecified: Secondary | ICD-10-CM

## 2019-01-27 DIAGNOSIS — R5383 Other fatigue: Secondary | ICD-10-CM

## 2019-01-27 LAB — POC URINE PREG, ED: Preg Test, Ur: NEGATIVE

## 2019-01-27 LAB — POCT PREGNANCY, URINE: Preg Test, Ur: NEGATIVE

## 2019-01-27 MED ORDER — MEGESTROL ACETATE 40 MG PO TABS: 40.0000 mg | ORAL_TABLET | Freq: Every day | ORAL | 0 refills | Status: AC

## 2019-01-27 NOTE — ED Triage Notes (Signed)
Pt states she didn't have a period last month, pt states she saw vaginal bleeding on 01/16/2019, and it gradually "came on slowly". States its been light bleeding this whole time. States nausea as well and fatigue sometimes.

## 2019-01-28 NOTE — ED Provider Notes (Signed)
[facility] CARE CENTER   [id number] 01/27/19 Arrival Time: 1007  ASSESSMENT & PLAN:  1. Fatigue, unspecified type   2. Nausea without vomiting   3. Abnormal vaginal bleeding     Vaginal bleeding bothering her the most. Apparently has been dealing with this off/on for many years.  Begin: Meds ordered this encounter  Medications  . megestrol (MEGACE) 40 MG tablet    Sig: Take 1 tablet (40 mg total) by mouth daily for 14 days.    Dispense:  14 tablet    Refill:  0    UPT: negative.   Recommend: Follow-up Information    Center for [facility].   Specialty: Obstetrics and Gynecology Why: If symptoms worsen in any way. Contact information: [address], [address] [id number] mc [place] [address] [phone]         No current nausea. Tolerating PO without difficulty.  Reviewed expectations re: course of current medical issues. Questions answered. Outlined signs and symptoms indicating need for more acute intervention. Patient verbalized understanding. After Visit Summary given.   SUBJECTIVE:  Angela Hammond is a 24 y.o. female who presents with complaint of vaginal bleeding. Irregular. Did not have a period last month. Now bleeding for the past 7-10 days; light but persistent. Not improving. No associated abd/pelvic pain. Is fatigued in general. Occasional nausea without emesis. Normal PO intake. No concern over STI. No vaginal d/c reported. Normal urination.  Patient's last menstrual period was 12/06/2018.  ROS: As per HPI.  OBJECTIVE:  Vitals:   01/27/19 1057 01/27/19 1059  BP:  135/80  Pulse: 89   Resp: 18   Temp:  98.3 F (36.8 C)  SpO2: 100%      General appearance: alert, cooperative, appears stated age and no distress Throat: lips, mucosa, and tongue normal; teeth and gums normal CV: RRR Lungs: CTAB Back: no CVA tenderness; FROM at waist Abdomen: soft, non-tender GU: deferred Skin:  warm and dry Psychological: alert and cooperative; normal mood and affect.  Results for orders placed or performed during the hospital encounter of 01/27/19  Pregnancy, urine POC  Result Value Ref Range   Preg Test, Ur NEGATIVE NEGATIVE  POC urine preg, ED (not at [facility])  Result Value Ref Range   Preg Test, Ur NEGATIVE NEGATIVE    Labs Reviewed  POCT PREGNANCY, URINE  POC URINE PREG, ED    No Known Allergies  Past Medical History:  Diagnosis Date  . History of gonorrhea    2013   Family History  Problem Relation Age of Onset  . Stroke Mother   . Hypertension Mother   . Hypertension Father   . Diabetes Maternal Aunt   . Stroke Maternal Aunt   . Hypertension Maternal Aunt   . Stroke Maternal Grandfather   . Hypertension Maternal Grandfather    Social History   Socioeconomic History  . Marital status: Single    Spouse name: Not on file  . Number of children: Not on file  . Years of education: Not on file  . Highest education level: Not on file  Occupational History  . Not on file  Tobacco Use  . Smoking status: Never Smoker  . Smokeless tobacco: Never Used  Substance and Sexual Activity  . Alcohol use: Not Currently    Comment: occasionally   . Drug use: No  . Sexual activity: Yes    Partners: Male    Birth control/protection: Condom  Other Topics Concern  . Not  on file  Social History Narrative  . Not on file   Social Determinants of Health   Financial Resource Strain:   . Difficulty of Paying Living Expenses: Not on file  Food Insecurity:   . Worried About Charity fundraiser in the Last Year: Not on file  . Ran Out of Food in the Last Year: Not on file  Transportation Needs:   . Lack of Transportation (Medical): Not on file  . Lack of Transportation (Non-Medical): Not on file  Physical Activity:   . Days of Exercise per Week: Not on file  . Minutes of Exercise per Session: Not on file  Stress:   . Feeling of Stress : Not on file  Social  Connections:   . Frequency of Communication with Friends and Family: Not on file  . Frequency of Social Gatherings with Friends and Family: Not on file  . Attends Religious Services: Not on file  . Active Member of Clubs or Organizations: Not on file  . Attends Archivist Meetings: Not on file  . Marital Status: Not on file  Intimate Partner Violence:   . Fear of Current or Ex-Partner: Not on file  . Emotionally Abused: Not on file  . Physically Abused: Not on file  . Sexually Abused: Not on file          Vanessa Kick, MD 01/28/19 [phone]

## 2019-03-14 ENCOUNTER — Emergency Department (HOSPITAL_COMMUNITY): Payer: Self-pay

## 2019-03-14 ENCOUNTER — Emergency Department (HOSPITAL_COMMUNITY)
Admission: EM | Admit: 2019-03-14 | Discharge: 2019-03-14 | Disposition: A | Discharge status: Home / Self Care | Payer: Self-pay | Attending: Emergency Medicine | Admitting: Emergency Medicine

## 2019-03-14 ENCOUNTER — Encounter (HOSPITAL_COMMUNITY): Payer: Self-pay | Admitting: Emergency Medicine

## 2019-03-14 ENCOUNTER — Other Ambulatory Visit: Payer: Self-pay

## 2019-03-14 DIAGNOSIS — Z79899 Other long term (current) drug therapy: Secondary | ICD-10-CM | POA: Insufficient documentation

## 2019-03-14 DIAGNOSIS — N760 Acute vaginitis: Secondary | ICD-10-CM | POA: Insufficient documentation

## 2019-03-14 DIAGNOSIS — N939 Abnormal uterine and vaginal bleeding, unspecified: Secondary | ICD-10-CM | POA: Insufficient documentation

## 2019-03-14 DIAGNOSIS — B9689 Other specified bacterial agents as the cause of diseases classified elsewhere: Secondary | ICD-10-CM

## 2019-03-14 DIAGNOSIS — R102 Pelvic and perineal pain: Secondary | ICD-10-CM

## 2019-03-14 LAB — COMPREHENSIVE METABOLIC PANEL
ALT: 13 U/L (ref 0–44)
AST: 19 U/L (ref 15–41)
Albumin: 3.7 g/dL (ref 3.5–5.0)
Alkaline Phosphatase: 45 U/L (ref 38–126)
Anion gap: 11 (ref 5–15)
BUN: 10 mg/dL (ref 6–20)
CO2: 23 mmol/L (ref 22–32)
Calcium: 9.3 mg/dL (ref 8.9–10.3)
Chloride: 105 mmol/L (ref 98–111)
Creatinine, Ser: 0.86 mg/dL (ref 0.44–1.00)
GFR calc Af Amer: >60 mL/min (ref >60)
GFR calc non Af Amer: >60 mL/min (ref >60)
Glucose, Bld: 95 mg/dL (ref 70–99)
Potassium: 4.3 mmol/L (ref 3.5–5.1)
Sodium: 139 mmol/L (ref 135–145)
Total Bilirubin: 0.7 mg/dL (ref 0.3–1.2)
Total Protein: 7.3 g/dL (ref 6.5–8.1)

## 2019-03-14 LAB — HIV ANTIBODY (ROUTINE TESTING W REFLEX): HIV Screen 4th Generation wRfx: NONREACTIVE

## 2019-03-14 LAB — I-STAT BETA HCG BLOOD, ED (MC, WL, AP ONLY): I-stat hCG, quantitative: <5 m[IU]/mL (ref <5)

## 2019-03-14 LAB — CBC WITH DIFFERENTIAL/PLATELET
Abs Immature Granulocytes: 0.02 10*3/uL (ref 0.00–0.07)
Basophils Absolute: 0 10*3/uL (ref 0.0–0.1)
Basophils Relative: 0 %
Eosinophils Absolute: 0.1 10*3/uL (ref 0.0–0.5)
Eosinophils Relative: 1 %
HCT: 41.9 % (ref 36.0–46.0)
Hemoglobin: 12.9 g/dL (ref 12.0–15.0)
Immature Granulocytes: 0 %
Lymphocytes Relative: 37 %
Lymphs Abs: 1.9 10*3/uL (ref 0.7–4.0)
MCH: 25.5 pg — ABNORMAL LOW (ref 26.0–34.0)
MCHC: 30.8 g/dL (ref 30.0–36.0)
MCV: 82.8 fL (ref 80.0–100.0)
Monocytes Absolute: 0.5 10*3/uL (ref 0.1–1.0)
Monocytes Relative: 10 %
Neutro Abs: 2.6 10*3/uL (ref 1.7–7.7)
Neutrophils Relative %: 52 %
Platelets: 231 10*3/uL (ref 150–400)
RBC: 5.06 MIL/uL (ref 3.87–5.11)
RDW: 15 % (ref 11.5–15.5)
WBC: 5.1 10*3/uL (ref 4.0–10.5)
nRBC: 0 % (ref 0.0–0.2)

## 2019-03-14 LAB — URINALYSIS, ROUTINE W REFLEX MICROSCOPIC
Bacteria, UA: NONE SEEN
Bilirubin Urine: NEGATIVE
Glucose, UA: NEGATIVE mg/dL
Ketones, ur: NEGATIVE mg/dL
Leukocytes,Ua: NEGATIVE
Nitrite: NEGATIVE
Protein, ur: NEGATIVE mg/dL
Specific Gravity, Urine: 1.01 (ref 1.005–1.030)
pH: 6 (ref 5.0–8.0)

## 2019-03-14 LAB — WET PREP, GENITAL
Sperm: NONE SEEN
Trich, Wet Prep: NONE SEEN
Yeast Wet Prep HPF POC: NONE SEEN

## 2019-03-14 MED ORDER — METRONIDAZOLE 500 MG PO TABS: 500.0000 mg | ORAL_TABLET | Freq: Two times a day (BID) | ORAL | 0 refills | Status: DC

## 2019-03-14 NOTE — ED Triage Notes (Signed)
Pt reports moderate vaginal bleeding that began yesterday, states that she had her MP 1 week ago. She also reports hx of the same a while back that she was seen at urgent care and given medication to stop the bleeding. She endorses irregular periods normally, is not currently on any birth control.

## 2019-03-14 NOTE — ED Notes (Signed)
Patient verbalizes understanding of discharge instructions. Opportunity for questioning and answers were provided. Pt discharged from ED. 

## 2019-03-14 NOTE — ED Provider Notes (Signed)
[facility] EMERGENCY DEPARTMENT Provider Note   CSN: [id number] Arrival date & time: 03/14/19  7616     History Chief Complaint  Patient presents with  . Vaginal Bleeding    Angela Hammond is a 24 y.o. female with a history of PID who presents to the ED with complaints of vaginal bleeding that began yesterday. Patient states she previously was having normal menstrual cycles once per month which would last about 7 days. She relays that she missed her period in December, started it around the new year and then had bleeding for > 7 days prompting UC visit 01/27/19. She was given 14 day course of megace @ UC visit which she took with stopping of bleeding, she subsequently had a period 02/07 that lasted about 1 week then resolved, but she started bleeding again last night which concerned her. States blood flow is moderate, no alleviating/aggravating factors, mild associated pelvic cramping as well as some fatigue. Denies fever, chills, emesis, lightheadedness, dizziness, or syncope. Denies atypical vaginal discharge, dysuria, or urgency. She is sexually active in a monogamous relationship and is not concerned about STDs. This does not feel similar to her prior PID documented- on chart review for additional history it appears her GC/chlamydia were negative during that visit.   HPI     Past Medical History:  Diagnosis Date  . History of gonorrhea    2013    Patient Active Problem List   Diagnosis Date Noted  . Vaginitis and vulvovaginitis, unspecified 06/11/2012  . Gonorrhea 04/19/2011  . PID (acute pelvic inflammatory disease) 04/18/2011  . Inadequate social support 04/18/2011  . Obesity (BMI 35.0-39.9 without comorbidity) 04/18/2011  . High risk sexual behavior 04/18/2011  . Pelvic inflammatory disease (PID) 04/18/2011  . Morbid obesity ([place]) 04/18/2011    Past Surgical History:  Procedure Laterality Date  . TONSILLECTOMY     AGE 48     OB History    Gravida   0   Para  0   Term  0   Preterm  0   AB  0   Living  0     SAB  0   TAB  0   Ectopic  0   Multiple  0   Live Births              Family History  Problem Relation Age of Onset  . Stroke Mother   . Hypertension Mother   . Hypertension Father   . Diabetes Maternal Aunt   . Stroke Maternal Aunt   . Hypertension Maternal Aunt   . Stroke Maternal Grandfather   . Hypertension Maternal Grandfather     Social History   Tobacco Use  . Smoking status: Never Smoker  . Smokeless tobacco: Never Used  Substance Use Topics  . Alcohol use: Not Currently    Comment: occasionally   . Drug use: No    Home Medications Prior to Admission medications   Medication Sig Start Date End Date Taking? Authorizing Provider  dicyclomine (BENTYL) 20 MG tablet Take 1 tablet (20 mg total) by mouth 2 (two) times daily as needed for spasms. 03/17/18 01/27/19  Caccavale, Sophia, PA-C  fluticasone (FLONASE) 50 MCG/ACT nasal spray Place 1 spray into both nostrils daily. 10/12/17 01/27/19  Frederica Kuster, PA-C    Allergies    Patient has no known allergies.  Review of Systems   Review of Systems  Constitutional: Positive for fatigue. Negative for chills and fever.  Respiratory:  Negative for shortness of breath.   Cardiovascular: Negative for chest pain.  Gastrointestinal: Negative for diarrhea, nausea and vomiting.  Genitourinary: Positive for pelvic pain (mild intermittent cramping) and vaginal bleeding. Negative for dysuria, frequency, urgency and vaginal discharge.  Neurological: Negative for dizziness, syncope and light-headedness.  All other systems reviewed and are negative.   Physical Exam Updated Vital Signs BP 138/89 (BP Location: Right Arm)   Pulse 83   Temp 98.4 F (36.9 C) (Oral)   Resp 18   Ht 5\' 4"  (1.626 m)   Wt 121.1 kg   SpO2 99%   BMI 45.83 kg/m   Physical Exam Vitals and nursing note reviewed. Exam conducted with a chaperone present.  Constitutional:       General: She is not in acute distress.    Appearance: She is well-developed. She is not toxic-appearing.  HENT:     Head: Normocephalic and atraumatic.  Eyes:     General:        Right eye: No discharge.        Left eye: No discharge.     Conjunctiva/sclera: Conjunctivae normal.  Cardiovascular:     Rate and Rhythm: Normal rate and regular rhythm.  Pulmonary:     Effort: Pulmonary effort is normal. No respiratory distress.     Breath sounds: Normal breath sounds. No wheezing, rhonchi or rales.  Abdominal:     General: There is no distension.     Palpations: Abdomen is soft.     Tenderness: There is no abdominal tenderness. There is no guarding or rebound.  Genitourinary:    Labia:        Right: No lesion.        Left: No lesion.      Comments: Moderate blood present.  No discharge.  Diffusely uncomfortable throughout bimanual without focal tenderness or specific CMT.  Musculoskeletal:     Cervical back: Neck supple.  Skin:    General: Skin is warm and dry.     Findings: No rash.  Neurological:     Mental Status: She is alert.     Comments: Clear speech.   Psychiatric:        Behavior: Behavior normal.     ED Results / Procedures / Treatments   Labs (all labs ordered are listed, but only abnormal results are displayed) Labs Reviewed  WET PREP, GENITAL - Abnormal; Notable for the following components:      Result Value   Clue Cells Wet Prep HPF POC PRESENT (*)    WBC, Wet Prep HPF POC FEW (*)    All other components within normal limits  CBC WITH DIFFERENTIAL/PLATELET - Abnormal; Notable for the following components:   MCH 25.5 (*)    All other components within normal limits  URINALYSIS, ROUTINE W REFLEX MICROSCOPIC - Abnormal; Notable for the following components:   Hgb urine dipstick LARGE (*)    All other components within normal limits  HIV ANTIBODY (ROUTINE TESTING W REFLEX)  COMPREHENSIVE METABOLIC PANEL  RPR  I-STAT BETA HCG BLOOD, ED ([facility], [facility], [facility] ONLY)    GC/CHLAMYDIA PROBE AMP ([place]) NOT AT [facility]    EKG None  Radiology [facility] PELVIC COMPLETE W TRANSVAGINAL AND TORSION R/O  Result Date: 03/14/2019 CLINICAL DATA:  24 year old female with pelvic pain and irregular bleeding for 1 day EXAM: TRANSABDOMINAL AND TRANSVAGINAL ULTRASOUND OF PELVIS DOPPLER ULTRASOUND OF OVARIES TECHNIQUE: Both transabdominal and transvaginal ultrasound examinations of the pelvis were performed. Transabdominal technique was performed for  global imaging of the pelvis including uterus, ovaries, adnexal regions, and pelvic cul-de-sac. It was necessary to proceed with endovaginal exam following the transabdominal exam to visualize the ovaries and endometrium. Color and duplex Doppler ultrasound was utilized to evaluate blood flow to the ovaries. COMPARISON:  None. FINDINGS: Uterus Measurements: 7 x 3.5 x 4.1 cm = volume: 54 mL. No fibroids or other mass visualized. Endometrium Thickness: 7 mm.  No focal abnormality visualized. Right ovary Measurements: 3.1 x 1.5 x 2.3 cm = volume: 5.8 mL. Normal appearance/no adnexal mass. Left ovary Measurements: 2.7 x 1.9 x 2.2 cm = volume: 6 mL. Normal appearance/no adnexal mass. Pulsed Doppler evaluation of both ovaries demonstrates normal low-resistance arterial and venous waveforms. Other findings No abnormal free fluid. IMPRESSION: Unremarkable pelvic ultrasound. Normal ovaries. No evidence of ovarian torsion. Electronically Signed   By: Harmon Pier M.D.   On: 03/14/2019 12:17    Procedures Procedures (including critical care time)  Medications Ordered in ED Medications - No data to display  ED Course  I have reviewed the triage vital signs and the nursing notes.  Pertinent labs & imaging results that were available during my care of the patient were reviewed by me and considered in my medical decision making (see chart for details).    MDM Rules/Calculators/A&P                     Patient presents to the ED with complaints of  vaginal bleeding that began yesterday, has had irregular bleeding over the past 2 months & was recently on Megace per UC. Additional history obtained through chart review. She is nontoxic, vitals without significant abnormality. Abdomen nontender w/o peritoneal signs. Vaginal bleeding noted on speculum exam, no significant discharge or friability, diffuse discomfort throughout bimanual but does not seem particularly consistent w/ PID. Plan for labs & [place].   I have personally reviewed and interpreted lab and imaging testing. CBC: No significant anemia. CMP: No significant electrolyte derangement. Urinalysis: Consistent with vaginal bleeding, no UTI. Pregnancy test: Negative Wet prep: Findings of BV, discussed with the patient and she would prefer treatment, will provide Flagyl. Ultrasound: No acute process.  12:55: RE-EVAL: Patient remains well appearing & without peritoneal signs. Discussed all results & plan of care.   With negative pregnancy test doubt ectopic pregnancy or spontaneous abortion.  No significant discharge on exam, in a monogamous relationship, did not seem particularly consistent with PID- as discussed above when she was diagnosed with PID on chart review it appears she had negative STI testing at that time.  Ultrasound reassuring.  No anemia.  Overall patient appears appropriate for discharge home with women's follow-up. I discussed results, treatment plan, need for follow-up, and return precautions with the patient. Provided opportunity for questions, patient confirmed understanding and is in agreement with plan.   Final Clinical Impression(s) / ED Diagnoses Final diagnoses:  Vaginal bleeding  BV (bacterial vaginosis)    Rx / DC Orders ED Discharge Orders         Ordered    metroNIDAZOLE (FLAGYL) 500 MG tablet  2 times daily     03/14/19 [address] [place], PA-C 03/14/19 1258    Arby Barrette, MD 03/15/19 [phone]

## 2019-03-14 NOTE — Discharge Instructions (Signed)
You were seen in the emergency department today for vaginal bleeding.  Your pregnancy test was negative.  Your labs were reassuring.  Your wet prep showed findings of bacterial vaginosis, please see attached handout, we are treating this with Flagyl, please take this for the next 1 week to treat this.  Do not drink alcohol when taking Flagyl as it can be extremely dangerous.  We have prescribed you new medication(s) today. Discuss the medications prescribed today with your pharmacist as they can have adverse effects and interactions with your other medicines including over the counter and prescribed medications. Seek medical evaluation if you start to experience new or abnormal symptoms after taking one of these medicines, seek care immediately if you start to experience difficulty breathing, feeling of your throat closing, facial swelling, or rash as these could be indications of a more serious allergic reaction  Your ultrasound did not show any significant abnormalities.  We would like you to follow-up with our women's clinic within 3 days for reevaluation and to discuss further management.  Return to the emergency department for new or worsening symptoms including but not limited to increased bleeding, increased pain, lightheadedness, dizziness, passing out, chest pain, trouble breathing, fever, or any other concerns.

## 2019-03-15 LAB — RPR: RPR Ser Ql: NONREACTIVE

## 2019-03-17 LAB — GC/CHLAMYDIA PROBE AMP (~~LOC~~) NOT AT ARMC
Chlamydia: NEGATIVE
Neisseria Gonorrhea: NEGATIVE

## 2020-01-16 NOTE — L&D Delivery Note (Signed)
Delivery Note At 11:02 PM a non-viable female was delivered via Vaginal, Breech.  APGAR: 0, 0; weight n/a. Patient given 400 mcg buccal cytotec after vaginal delivery to assist in delivery of placenta. Placenta delivered at 0730. Placenta status: intact, after cytotec.  Cord: 3v  with the following complications: delayed delivery. Placenta to pathology. Dr. Debroah Loop present for speculum exam postpartum and delivery of placenta.  Anesthesia: none Episiotomy: none Lacerations: none Est. Blood Loss (mL):    Mom to postpartum.  Baby to [place].  Alric Seton 07/08/2020, 11:29 PM

## 2020-07-08 ENCOUNTER — Inpatient Hospital Stay (HOSPITAL_COMMUNITY): Payer: Medicaid Other

## 2020-07-08 ENCOUNTER — Observation Stay (HOSPITAL_COMMUNITY)
Admission: EM | Admit: 2020-07-08 | Discharge: 2020-07-09 | Disposition: A | Discharge status: Home / Self Care | Payer: Medicaid Other | Attending: Obstetrics & Gynecology | Admitting: Obstetrics & Gynecology

## 2020-07-08 ENCOUNTER — Other Ambulatory Visit: Payer: Self-pay

## 2020-07-08 ENCOUNTER — Encounter (HOSPITAL_COMMUNITY): Payer: Self-pay | Admitting: Obstetrics & Gynecology

## 2020-07-08 DIAGNOSIS — O42919 Preterm premature rupture of membranes, unspecified as to length of time between rupture and onset of labor, unspecified trimester: Secondary | ICD-10-CM

## 2020-07-08 DIAGNOSIS — O209 Hemorrhage in early pregnancy, unspecified: Secondary | ICD-10-CM | POA: Diagnosis present

## 2020-07-08 DIAGNOSIS — O42013 Preterm premature rupture of membranes, onset of labor within 24 hours of rupture, third trimester: Secondary | ICD-10-CM

## 2020-07-08 DIAGNOSIS — O42912 Preterm premature rupture of membranes, unspecified as to length of time between rupture and onset of labor, second trimester: Secondary | ICD-10-CM

## 2020-07-08 DIAGNOSIS — Z823 Family history of stroke: Secondary | ICD-10-CM | POA: Diagnosis not present

## 2020-07-08 DIAGNOSIS — N939 Abnormal uterine and vaginal bleeding, unspecified: Secondary | ICD-10-CM | POA: Diagnosis present

## 2020-07-08 DIAGNOSIS — Z8249 Family history of ischemic heart disease and other diseases of the circulatory system: Secondary | ICD-10-CM | POA: Diagnosis not present

## 2020-07-08 DIAGNOSIS — Z833 Family history of diabetes mellitus: Secondary | ICD-10-CM | POA: Diagnosis not present

## 2020-07-08 DIAGNOSIS — O321XX0 Maternal care for breech presentation, not applicable or unspecified: Secondary | ICD-10-CM

## 2020-07-08 DIAGNOSIS — Z3A18 18 weeks gestation of pregnancy: Secondary | ICD-10-CM | POA: Diagnosis not present

## 2020-07-08 DIAGNOSIS — O021 Missed abortion: Secondary | ICD-10-CM | POA: Diagnosis not present

## 2020-07-08 DIAGNOSIS — O99212 Obesity complicating pregnancy, second trimester: Secondary | ICD-10-CM | POA: Diagnosis not present

## 2020-07-08 DIAGNOSIS — Z3A19 19 weeks gestation of pregnancy: Secondary | ICD-10-CM | POA: Diagnosis not present

## 2020-07-08 DIAGNOSIS — O4102X0 Oligohydramnios, second trimester, not applicable or unspecified: Secondary | ICD-10-CM

## 2020-07-08 DIAGNOSIS — O4690 Antepartum hemorrhage, unspecified, unspecified trimester: Secondary | ICD-10-CM

## 2020-07-08 DIAGNOSIS — Z20822 Contact with and (suspected) exposure to covid-19: Secondary | ICD-10-CM | POA: Insufficient documentation

## 2020-07-08 DIAGNOSIS — O99214 Obesity complicating childbirth: Secondary | ICD-10-CM | POA: Diagnosis not present

## 2020-07-08 HISTORY — DX: Preterm premature rupture of membranes, unspecified as to length of time between rupture and onset of labor, unspecified trimester: O42.919

## 2020-07-08 LAB — RESP PANEL BY RT-PCR (FLU A&B, COVID) ARPGX2
Influenza A by PCR: NEGATIVE
Influenza B by PCR: NEGATIVE
SARS Coronavirus 2 by RT PCR: NEGATIVE

## 2020-07-08 LAB — CBC
HCT: 35.6 % — ABNORMAL LOW (ref 36.0–46.0)
Hemoglobin: 11.4 g/dL — ABNORMAL LOW (ref 12.0–15.0)
MCH: 25.3 pg — ABNORMAL LOW (ref 26.0–34.0)
MCHC: 32 g/dL (ref 30.0–36.0)
MCV: 79.1 fL — ABNORMAL LOW (ref 80.0–100.0)
Platelets: 141 10*3/uL — ABNORMAL LOW (ref 150–400)
RBC: 4.5 MIL/uL (ref 3.87–5.11)
RDW: 15.4 % (ref 11.5–15.5)
WBC: 7.4 10*3/uL (ref 4.0–10.5)
nRBC: 0 % (ref 0.0–0.2)

## 2020-07-08 LAB — ABO/RH: ABO/RH(D): A POS

## 2020-07-08 LAB — WET PREP, GENITAL
Clue Cells Wet Prep HPF POC: NONE SEEN
Sperm: NONE SEEN
Trich, Wet Prep: NONE SEEN
Yeast Wet Prep HPF POC: NONE SEEN

## 2020-07-08 LAB — HCG, QUANTITATIVE, PREGNANCY: hCG, Beta Chain, Quant, S: 14954 m[IU]/mL — ABNORMAL HIGH (ref <5)

## 2020-07-08 LAB — TYPE AND SCREEN
ABO/RH(D): A POS
Antibody Screen: NEGATIVE

## 2020-07-08 LAB — I-STAT BETA HCG BLOOD, ED (MC, WL, AP ONLY): I-stat hCG, quantitative: >2000 m[IU]/mL — ABNORMAL HIGH (ref <5)

## 2020-07-08 MED ORDER — FENTANYL-BUPIVACAINE-NACL 0.5-0.125-0.9 MG/250ML-% EP SOLN: 12.0000 mL/h | EPIDURAL | Status: DC | PRN

## 2020-07-08 MED ORDER — FENTANYL CITRATE (PF) 100 MCG/2ML IJ SOLN
50.0000 ug | INTRAMUSCULAR | Status: DC | PRN
  Administered 2020-07-08 – 2020-07-09 (×5): 100 ug via INTRAVENOUS
  Filled 2020-07-08 (×5): qty 2

## 2020-07-08 MED ORDER — DIPHENHYDRAMINE HCL 50 MG/ML IJ SOLN: 12.5000 mg | INTRAMUSCULAR | Status: DC | PRN

## 2020-07-08 MED ORDER — PHENYLEPHRINE 40 MCG/ML (10ML) SYRINGE FOR IV PUSH (FOR BLOOD PRESSURE SUPPORT): 80.0000 ug | PREFILLED_SYRINGE | INTRAVENOUS | Status: DC | PRN

## 2020-07-08 MED ORDER — MISOPROSTOL 200 MCG PO TABS
800.0000 ug | ORAL_TABLET | Freq: Once | ORAL | Status: AC
  Administered 2020-07-08: 800 ug via VAGINAL
  Filled 2020-07-08: qty 4

## 2020-07-08 MED ORDER — OXYCODONE-ACETAMINOPHEN 5-325 MG PO TABS: 2.0000 | ORAL_TABLET | ORAL | Status: DC | PRN

## 2020-07-08 MED ORDER — SOD CITRATE-CITRIC ACID 500-334 MG/5ML PO SOLN: 30.0000 mL | ORAL | Status: DC | PRN

## 2020-07-08 MED ORDER — ONDANSETRON HCL 4 MG/2ML IJ SOLN: 4.0000 mg | Freq: Four times a day (QID) | INTRAMUSCULAR | Status: DC | PRN

## 2020-07-08 MED ORDER — MORPHINE SULFATE (PF) 4 MG/ML IV SOLN
2.0000 mg | Freq: Once | INTRAVENOUS | Status: AC
  Administered 2020-07-08: 2 mg via INTRAMUSCULAR
  Filled 2020-07-08: qty 1

## 2020-07-08 MED ORDER — LACTATED RINGERS IV SOLN: 500.0000 mL | Freq: Once | INTRAVENOUS | Status: DC

## 2020-07-08 MED ORDER — ACETAMINOPHEN 325 MG PO TABS: 650.0000 mg | ORAL_TABLET | ORAL | Status: DC | PRN

## 2020-07-08 MED ORDER — EPHEDRINE 5 MG/ML INJ: 10.0000 mg | INTRAVENOUS | Status: DC | PRN

## 2020-07-08 MED ORDER — FLEET ENEMA 7-19 GM/118ML RE ENEM: 1.0000 | ENEMA | Freq: Every day | RECTAL | Status: DC | PRN

## 2020-07-08 MED ORDER — ZOLPIDEM TARTRATE 5 MG PO TABS: 5.0000 mg | ORAL_TABLET | Freq: Every evening | ORAL | Status: DC | PRN

## 2020-07-08 MED ORDER — MISOPROSTOL 200 MCG PO TABS
400.0000 ug | ORAL_TABLET | ORAL | Status: DC
  Administered 2020-07-08 – 2020-07-09 (×2): 400 ug via BUCCAL
  Filled 2020-07-08 (×3): qty 2

## 2020-07-08 MED ORDER — LACTATED RINGERS IV SOLN: INTRAVENOUS | Status: DC

## 2020-07-08 MED ORDER — HYDROXYZINE HCL 50 MG PO TABS
50.0000 mg | ORAL_TABLET | Freq: Four times a day (QID) | ORAL | Status: DC | PRN
  Administered 2020-07-08: 50 mg via ORAL
  Filled 2020-07-08: qty 1

## 2020-07-08 MED ORDER — LIDOCAINE HCL (PF) 1 % IJ SOLN: 30.0000 mL | INTRAMUSCULAR | Status: DC | PRN

## 2020-07-08 MED ORDER — OXYTOCIN-SODIUM CHLORIDE 30-0.9 UT/500ML-% IV SOLN
2.5000 [IU]/h | INTRAVENOUS | Status: DC
  Filled 2020-07-08: qty 500

## 2020-07-08 MED ORDER — OXYTOCIN BOLUS FROM INFUSION: 333.0000 mL | Freq: Once | INTRAVENOUS | Status: DC

## 2020-07-08 MED ORDER — OXYCODONE-ACETAMINOPHEN 5-325 MG PO TABS: 1.0000 | ORAL_TABLET | ORAL | Status: DC | PRN

## 2020-07-08 MED ORDER — LACTATED RINGERS IV SOLN: 500.0000 mL | INTRAVENOUS | Status: DC | PRN

## 2020-07-08 NOTE — MAU Provider Note (Signed)
History     CSN: [id number]  Arrival date and time: 07/08/20 1146   Event Date/Time   First Provider Initiated Contact with Patient 07/08/20 1341      Chief Complaint  Patient presents with   Vaginal Bleeding   Abdominal Pain   24 y.o. G1 @19 .1 wks by LMP presenting with VB and passing something into toilet. Reports cramping started this am then she went to the BR to have a BM and passed something from her vagina. Afterward she was bleeding heavily and had to use a towel to clean herself up. Continues to have cramping. She did not think she was pregnant. Has not taken a pregnancy test.    OB History     Gravida  1   Para  0   Term  0   Preterm  0   AB  0   Living  0      SAB  0   IAB  0   Ectopic  0   Multiple  0   Live Births              Past Medical History:  Diagnosis Date   History of gonorrhea    2013    Past Surgical History:  Procedure Laterality Date   TONSILLECTOMY     AGE 48    Family History  Problem Relation Age of Onset   Stroke Mother    Hypertension Mother    Hypertension Father    Diabetes Maternal Aunt    Stroke Maternal Aunt    Hypertension Maternal Aunt    Stroke Maternal Grandfather    Hypertension Maternal Grandfather     Social History   Tobacco Use   Smoking status: Never   Smokeless tobacco: Never  Substance Use Topics   Alcohol use: Not Currently    Comment: occasionally    Drug use: No    Allergies: No Known Allergies  Medications Prior to Admission  Medication Sig Dispense Refill Last Dose   metroNIDAZOLE (FLAGYL) 500 MG tablet Take 1 tablet (500 mg total) by mouth 2 (two) times daily. 14 tablet 0     Review of Systems  Gastrointestinal:  Positive for abdominal pain.  Genitourinary:  Positive for vaginal bleeding.  Physical Exam   Blood pressure 126/77, pulse 89, temperature 98.3 F (36.8 C), temperature source Oral, resp. rate 18, height 5\' 4"  (1.626 m), weight 124.6 kg, last menstrual period  02/25/2020, SpO2 100 %.  Physical Exam Vitals and nursing note reviewed. Exam conducted with a chaperone present.  Constitutional:      General: She is not in acute distress.    Appearance: Normal appearance.  HENT:     Head: Normocephalic and atraumatic.  Cardiovascular:     Rate and Rhythm: Normal rate.  Pulmonary:     Effort: Pulmonary effort is normal. No respiratory distress.  Genitourinary:    Comments: External: no lesions or erythema Vagina: rugated, pink, moist, moderate amt of blood; ?fetal membranes protruding from os, removed some with ring forceps but breaking apart  Cervix 1cm   Musculoskeletal:        General: Normal range of motion.     Cervical back: Normal range of motion.  Skin:    General: Skin is warm and dry.  Neurological:     General: No focal deficit present.     Mental Status: She is alert and oriented to person, place, and time.  Psychiatric:        Mood  and Affect: Mood normal.        Behavior: Behavior normal.   Results for orders placed or performed during the hospital encounter of 07/08/20 (from the past 24 hour(s))  I-Stat Beta hCG blood, ED ([facility], [facility], [facility] only)     Status: Abnormal   Collection Time: 07/08/20 12:06 PM  Result Value Ref Range   I-stat hCG, quantitative >2,000.0 (H) <5 mIU/mL   Comment 3          ABO/Rh     Status: None   Collection Time: 07/08/20  1:18 PM  Result Value Ref Range   ABO/RH(D) A POS    No rh immune globuloin      NOT A RH IMMUNE GLOBULIN CANDIDATE, PT RH POSITIVE Performed at [facility], [address] [place], [place] [address]   CBC     Status: Abnormal   Collection Time: 07/08/20  1:18 PM  Result Value Ref Range   WBC 7.4 4.0 - 10.5 K/uL   RBC 4.50 3.87 - 5.11 MIL/uL   Hemoglobin 11.4 (L) 12.0 - 15.0 g/dL   HCT 93.2 (L) 35.5 - 73.2 %   MCV 79.1 (L) 80.0 - 100.0 fL   MCH 25.3 (L) 26.0 - 34.0 pg   MCHC 32.0 30.0 - 36.0 g/dL   RDW 20.2 54.2 - 70.6 %   Platelets 141 (L) 150 - 400 K/uL   nRBC  0.0 0.0 - 0.2 %  hCG, quantitative, pregnancy     Status: Abnormal   Collection Time: 07/08/20  1:18 PM  Result Value Ref Range   hCG, Beta Chain, Quant, S 14,954 (H) <5 mIU/mL  Wet prep, genital     Status: Abnormal   Collection Time: 07/08/20  2:06 PM   Specimen: PATH Cytology Cervicovaginal Ancillary Only  Result Value Ref Range   Yeast Wet Prep HPF POC NONE SEEN NONE SEEN   Trich, Wet Prep NONE SEEN NONE SEEN   Clue Cells Wet Prep HPF POC NONE SEEN NONE SEEN   WBC, Wet Prep HPF POC MODERATE (A) NONE SEEN   Sperm NONE SEEN    No results found.  MAU Course  Procedures Morphine  MDM Labs and [place] ordered and reviewed. US shows 18w fetus with +FHT and anhydramnios. Dr. Macon Large in to talk with pt regarding dx and plan.  Assessment and Plan  [redacted] weeks gestation PPROM Admit to LD Mngt per labor team   Donette Larry, CNM 07/08/2020, 4:35 PM

## 2020-07-08 NOTE — MAU Note (Signed)
Earlier this morning, was having intense cramps down below. Has been having cramps and pain 'for some week'.  Yesterday, noted some blood and tissue. Went to the restroom, felt something passing out of her, from her vagina. Hadn't had a period since end of February.  Hadn't taken a pregnancy test. Still cramping- is subtle, not painful

## 2020-07-08 NOTE — Progress Notes (Signed)
MAU states GC/Ch collected and sent

## 2020-07-08 NOTE — ED Provider Notes (Signed)
Emergency Medicine Provider OB Triage Evaluation Note  Angela Hammond is a 25 y.o. female, G0P0000, at Unknown gestation who presents to the emergency department with complaints of vaginal bleeding and abdominal cramping.  She states that she felt something "fall into the toilet" but did not see what it was.   Last menses Feb 2022.  Never been pregnant, was unaware of current pregnancy.  Review of  Systems  Positive: Abdominal cramping, vaginal bleeding. Negative: Severe pain. Hx pregnancy.  Physical Exam  BP (!) 163/91   Pulse (!) 109   Temp 99 F (37.2 C) (Oral)   Resp 18   SpO2 100%  General: Awake, no distress  HEENT: Atraumatic  Resp: Normal effort  Cardiac: Normal rate Abd: Nondistended, nontender  MSK: Moves all extremities without difficulty Neuro: Speech clear  Medical Decision Making  Pt evaluated for pregnancy concern and is stable for transfer to MAU. Pt is in agreement with plan for transfer.  12:26 PM Discussed with MAU APP, Joni Reining, who accepts patient in transfer.  Clinical Impression  No diagnosis found.     Lorelee New, PA-C 07/08/20 1232    Tilden Fossa, MD 07/08/20 1432

## 2020-07-08 NOTE — ED Triage Notes (Signed)
Pt here from home with c/o vaginal bleeding and cramps , pt states that she felt something fall out of her but she did look into the toilet before she flushed , LMP February, still bleeding some now

## 2020-07-08 NOTE — H&P (Signed)
History    CSN: [id number]   Arrival date and time: 07/08/20 1146    Event Date/Time  First Provider Initiated Contact with Patient 07/08/20 1341          Chief Complaint  Patient presents with   Vaginal Bleeding   Abdominal Pain    25 y.o. G1 @19 .1 wks by LMP presenting with VB and passing something into toilet. Reports cramping started this am then she went to the BR to have a BM and passed something from her vagina. Afterward she was bleeding heavily and had to use a towel to clean herself up. Continues to have cramping. She did not think she was pregnant. Has not taken a pregnancy test.      OB History       Gravida  1   Para  0   Term  0   Preterm  0   AB  0   Living  0        SAB  0   IAB  0   Ectopic  0   Multiple  0   Live Births                      Past Medical History:  Diagnosis Date   History of gonorrhea      2013           Past Surgical History:  Procedure Laterality Date   TONSILLECTOMY        AGE 25           Family History  Problem Relation Age of Onset   Stroke Mother     Hypertension Mother     Hypertension Father     Diabetes Maternal Aunt     Stroke Maternal Aunt     Hypertension Maternal Aunt     Stroke Maternal Grandfather     Hypertension Maternal Grandfather        Social History         Tobacco Use   Smoking status: Never   Smokeless tobacco: Never  Substance Use Topics   Alcohol use: Not Currently      Comment: occasionally   Drug use: No      Allergies: No Known Allergies          Medications Prior to Admission  Medication Sig Dispense Refill Last Dose   metroNIDAZOLE (FLAGYL) 500 MG tablet Take 1 tablet (500 mg total) by mouth 2 (two) times daily. 14 tablet 0        Review of Systems Gastrointestinal:  Positive for abdominal pain. Genitourinary:  Positive for vaginal bleeding.  Physical Exam    Blood pressure 126/77, pulse 89, temperature 98.3 F (36.8 C), temperature source Oral,  resp. rate 18, height 5\' 4"  (1.626 m), weight 124.6 kg, last menstrual period 02/25/2020, SpO2 100 %.   Physical Exam Vitals and nursing note reviewed. Exam conducted with a chaperone present. Constitutional:      General: She is not in acute distress.    Appearance: Normal appearance. HENT:    Head: Normocephalic and atraumatic. Cardiovascular:    Rate and Rhythm: Normal rate. Pulmonary:    Effort: Pulmonary effort is normal. No respiratory distress. Genitourinary:    Comments: External: no lesions or erythema Vagina: rugated, pink, moist, moderate amt of blood; ?fetal membranes protruding from os, removed some with ring forceps but breaking apart Cervix 1cm     Musculoskeletal:        General: Normal  range of motion.    Cervical back: Normal range of motion. Skin:    General: Skin is warm and dry. Neurological:    General: No focal deficit present.    Mental Status: She is alert and oriented to person, place, and time. Psychiatric:        Mood and Affect: Mood normal.        Behavior: Behavior normal.   Lab Results Last 24 Hours        Results for orders placed or performed during the hospital encounter of 07/08/20 (from the past 24 hour(s))  I-Stat Beta hCG blood, ED ([facility], [facility], [facility] only)     Status: Abnormal    Collection Time: 07/08/20 12:06 PM  Result Value Ref Range    I-stat hCG, quantitative >2,000.0 (H) <5 mIU/mL    Comment 3           ABO/Rh     Status: None    Collection Time: 07/08/20  1:18 PM  Result Value Ref Range    ABO/RH(D) A POS      No rh immune globuloin          NOT A RH IMMUNE GLOBULIN CANDIDATE, PT RH POSITIVE Performed at [facility], [address] [place], [place] [address]    CBC     Status: Abnormal    Collection Time: 07/08/20  1:18 PM  Result Value Ref Range    WBC 7.4 4.0 - 10.5 K/uL    RBC 4.50 3.87 - 5.11 MIL/uL    Hemoglobin 11.4 (L) 12.0 - 15.0 g/dL    HCT 24.0 (L) 97.3 - 46.0 %    MCV 79.1 (L) 80.0 - 100.0 fL    MCH 25.3  (L) 26.0 - 34.0 pg    MCHC 32.0 30.0 - 36.0 g/dL    RDW 53.2 99.2 - 42.6 %    Platelets 141 (L) 150 - 400 K/uL    nRBC 0.0 0.0 - 0.2 %  hCG, quantitative, pregnancy     Status: Abnormal    Collection Time: 07/08/20  1:18 PM  Result Value Ref Range    hCG, Beta Chain, Quant, S 14,954 (H) <5 mIU/mL  Wet prep, genital     Status: Abnormal    Collection Time: 07/08/20  2:06 PM    Specimen: PATH Cytology Cervicovaginal Ancillary Only  Result Value Ref Range    Yeast Wet Prep HPF POC NONE SEEN NONE SEEN    Trich, Wet Prep NONE SEEN NONE SEEN    Clue Cells Wet Prep HPF POC NONE SEEN NONE SEEN    WBC, Wet Prep HPF POC MODERATE (A) NONE SEEN    Sperm NONE SEEN        No results found.   MAU Course  Procedures Morphine   MDM Labs and [place] ordered and reviewed. US shows 18w fetus with +FHT and anhydramnios. Dr. Macon Large in to talk with pt regarding dx and plan.   Assessment and Plan  [redacted] weeks gestation PPROM Admit to LD Mngt per labor team     Donette Larry, CNM 07/08/2020, 4:35 PM     Attestation of Attending Supervision of Advanced Practice Provider (PA/CNM/NP): Evaluation and management procedures were performed by the Advanced Practice Provider under my supervision and collaboration.  I have reviewed the Advanced Practice Provider's note and chart, and I agree with the management and plan. I have also made any necessary editorial changes.  Ultrasound findings reviewed with patient.  Discussed management  options for previable PPROM; offered expectant management vs misoprostol induction of labor.  All questions answered.  Patient desired misoprostol induction, she reports she may need epidural for pain.  Patient was told this process takes several hours. Possible complications of hemorrhage, infection, retained products which may need Dilation and Evacuation discussed.  Offered chaplain services, she will consider this. She is contacting her support people right now.  L&D RN in charge  notified of need for admission; orders placed.  L&D team also notified.    Jaynie Collins, MD, FACOG Obstetrician & Gynecologist, [facility] for [employer], [facility]

## 2020-07-08 NOTE — Discharge Summary (Signed)
Postpartum Discharge Summary  Date of Service updated-yes     Patient Name: Angela Hammond DOB: Aug 25, 1995 MRN: [id number]  Date of admission: 07/08/2020 Delivery date:07/08/2020  Delivering provider: Arrie Senate  Date of discharge: 07/09/2020  Admitting diagnosis: Preterm premature rupture of membranes [O42.919] Intrauterine pregnancy: [redacted]w[redacted]d    Secondary diagnosis:  Principal Problem:   Previable preterm premature rupture of membranes at [redacted] weeks gestation Active Problems:   Morbid obesity ([place]   Vaginal delivery  Additional problems: none    Discharge diagnosis:  Previable delivery                                               Post partum procedures: n/a Augmentation: Cytotec [facility] With Vaginal Delivery   25y.o. yo G1P0000 at 167w1das admitted to the hospital 07/08/2020 for induction of labor.  Indication for induction:  PPROM, nonviable .  Patient had an uncomplicated labor course as follows: Membrane Rupture Time/Date: 11:00 AM ,07/08/2020   Delivery Method:Vaginal, Breech  Episiotomy: None  Lacerations:  None  Details of delivery can be found in separate delivery note.  Patient had a routine postpartum course. Patient is discharged home 07/09/20.  Newborn Data: Birth date:07/08/2020  Birth time:11:02 PM  Gender:Female  Living status:Fetal Demise  Apgars:0 ,0  Weight:196 g   Magnesium Sulfate received: No BMZ received: No Rhophylac:N/A MMR:N/A T-DaP: n/a Flu: N/A Transfusion:No  Physical exam  Vitals:   07/09/20 0900 07/09/20 1000 07/09/20 1200 07/09/20 1410  BP: (!) 97/39 123/62 (!) 106/55 117/75  Pulse: 81 88 84 92  Resp: _0 Temp: 99 F (37.2 C) 98.6 F (37 C) 98.5 F (36.9 C) 98.5 F (36.9 C)  TempSrc: Oral Oral Oral Oral  SpO2:      Weight:      Height:       General: alert, cooperative, and no distress Lochia: appropriate Uterine Fundus: firm Incision: N/A DVT  Evaluation: No evidence of DVT seen on physical exam. Labs: Lab Results  Component Value Date   WBC 7.4 07/08/2020   HGB 11.4 (L) 07/08/2020   HCT 35.6 (L) 07/08/2020   MCV 79.1 (L) 07/08/2020   PLT 141 (L) 07/08/2020   CMP Latest Ref Rng & Units 03/14/2019  Glucose 70 - 99 mg/dL 95  BUN 6 - 20 mg/dL 10  Creatinine 0.44 - 1.00 mg/dL 0.86  Sodium 135 - 145 mmol/L 139  Potassium 3.5 - 5.1 mmol/L 4.3  Chloride 98 - 111 mmol/L 105  CO2 22 - 32 mmol/L 23  Calcium 8.9 - 10.3 mg/dL 9.3  Total Protein 6.5 - 8.1 g/dL 7.3  Total Bilirubin 0.3 - 1.2 mg/dL 0.7  Alkaline Phos 38 - 126 U/L 45  AST 15 - 41 U/L 19  ALT 0 - 44 U/L 13   Edinburgh Score: No flowsheet data found.   After visit meds:  Allergies as of 07/09/2020   No Known Allergies      Medication List     STOP taking these medications    metroNIDAZOLE 500 MG tablet Commonly known as: FLAGYL       TAKE these medications    ibuprofen 600 MG tablet Commonly known as: ADVIL Take 1 tablet (600 mg total) by mouth every 6 (six) hours.  Discharge home in stable condition Infant Feeding:  n/a Infant Disposition:morgue Discharge instruction: per After Visit Summary and Postpartum booklet. Activity: Advance as tolerated. Pelvic rest for 6 weeks.  Diet: routine diet Future Appointments:No future appointments. Follow up Visit: Message sent to [facility] 07/08/20 by [employer].   Please schedule this patient for a In person postpartum visit in 6 weeks with the following provider: Any provider. Additional Postpartum F/U:Postpartum Depression checkup in 1-2 weeks Low risk pregnancy complicated by:  n/a Delivery mode:  Vaginal, Breech  Anticipated Birth Control:  Unsure   07/09/2020 Melanie Bhambri, CNM    

## 2020-07-09 ENCOUNTER — Encounter (HOSPITAL_COMMUNITY): Payer: Self-pay

## 2020-07-09 LAB — RPR: RPR Ser Ql: NONREACTIVE

## 2020-07-09 MED ORDER — ONDANSETRON HCL 4 MG/2ML IJ SOLN: 4.0000 mg | INTRAMUSCULAR | Status: DC | PRN

## 2020-07-09 MED ORDER — PRENATAL MULTIVITAMIN CH: 1.0000 | ORAL_TABLET | Freq: Every day | ORAL | Status: DC

## 2020-07-09 MED ORDER — ACETAMINOPHEN 325 MG PO TABS: 650.0000 mg | ORAL_TABLET | ORAL | Status: DC | PRN

## 2020-07-09 MED ORDER — SIMETHICONE 80 MG PO CHEW
80.0000 mg | CHEWABLE_TABLET | ORAL | Status: DC | PRN
  Filled 2020-07-09: qty 1

## 2020-07-09 MED ORDER — SENNOSIDES-DOCUSATE SODIUM 8.6-50 MG PO TABS: 2.0000 | ORAL_TABLET | ORAL | Status: DC

## 2020-07-09 MED ORDER — DIBUCAINE (PERIANAL) 1 % EX OINT: 1.0000 "application " | TOPICAL_OINTMENT | CUTANEOUS | Status: DC | PRN

## 2020-07-09 MED ORDER — DIPHENHYDRAMINE HCL 25 MG PO CAPS: 25.0000 mg | ORAL_CAPSULE | Freq: Four times a day (QID) | ORAL | Status: DC | PRN

## 2020-07-09 MED ORDER — MEASLES, MUMPS & RUBELLA VAC IJ SOLR: 0.5000 mL | Freq: Once | INTRAMUSCULAR | Status: DC

## 2020-07-09 MED ORDER — IBUPROFEN 600 MG PO TABS: 600.0000 mg | ORAL_TABLET | Freq: Four times a day (QID) | ORAL | Status: DC

## 2020-07-09 MED ORDER — TETANUS-DIPHTH-ACELL PERTUSSIS 5-2.5-18.5 LF-MCG/0.5 IM SUSY: 0.5000 mL | PREFILLED_SYRINGE | Freq: Once | INTRAMUSCULAR | Status: DC

## 2020-07-09 MED ORDER — WITCH HAZEL-GLYCERIN EX PADS: 1.0000 "application " | MEDICATED_PAD | CUTANEOUS | Status: DC | PRN

## 2020-07-09 MED ORDER — COCONUT OIL OIL: 1.0000 "application " | TOPICAL_OIL | Status: DC | PRN

## 2020-07-09 MED ORDER — KETOROLAC TROMETHAMINE 30 MG/ML IJ SOLN
30.0000 mg | Freq: Once | INTRAMUSCULAR | Status: AC
  Administered 2020-07-09: 30 mg via INTRAVENOUS
  Filled 2020-07-09: qty 1

## 2020-07-09 MED ORDER — ONDANSETRON HCL 4 MG PO TABS: 4.0000 mg | ORAL_TABLET | ORAL | Status: DC | PRN

## 2020-07-09 MED ORDER — BENZOCAINE-MENTHOL 20-0.5 % EX AERO: 1.0000 "application " | INHALATION_SPRAY | CUTANEOUS | Status: DC | PRN

## 2020-07-09 MED ORDER — IBUPROFEN 600 MG PO TABS: 600.0000 mg | ORAL_TABLET | Freq: Four times a day (QID) | ORAL | 0 refills | Status: DC

## 2020-07-09 NOTE — Progress Notes (Signed)
Post Partum Day 1 Subjective: Patient is doing well without complaints. Ambulating without difficulty. Voiding and passing flatus. Tolerating PO. Patient received another dose of cytotec after fetal delivery to assist with delivery of placenta. Delivery of placenta this morning around 0730.   Objective: Blood pressure 135/65, pulse 78, temperature 98.5 F (36.9 C), temperature source Oral, resp. rate 16, height 5\' 4"  (1.626 m), weight 124.6 kg, last menstrual period 02/25/2020, SpO2 99 %.  Physical Exam:  General: alert, cooperative, and mild distress Lochia: appropriate Uterine Fundus: firm Incision: n/a DVT Evaluation: No evidence of DVT seen on physical exam.  Recent Labs    07/08/20 1318  HGB 11.4*  HCT 35.6*    Assessment/Plan: PPD#1  -doing well, meeting pp milestones  -placenta delivered intact this morning  -VSS  -EBL unremarkable  #Fetal demise  -SW consulted  Plan for discharge this afternoon if patient desires.   LOS: 1 day   07/10/20 07/09/2020, 7:44 AM

## 2020-07-09 NOTE — Progress Notes (Signed)
Chaplain responded to spiritual consult to offer support for pt.  Pt was on her side facing away from the door when Chaplain entered.  Pt. did not change position in the bed during the brief visit.  Pt not overly willing to engage with Chaplain.  Chaplain available if additional support needed.  Vernell Morgans Chaplain

## 2020-07-11 ENCOUNTER — Encounter: Payer: Self-pay | Admitting: Family Medicine

## 2020-07-11 LAB — GC/CHLAMYDIA PROBE AMP (~~LOC~~) NOT AT ARMC
Chlamydia: NEGATIVE
Comment: NEGATIVE
Comment: NORMAL
Neisseria Gonorrhea: NEGATIVE

## 2020-07-13 LAB — SURGICAL PATHOLOGY

## 2020-07-20 ENCOUNTER — Telehealth (HOSPITAL_COMMUNITY): Payer: Self-pay | Admitting: *Deleted

## 2020-07-20 DIAGNOSIS — Z1331 Encounter for screening for depression: Secondary | ICD-10-CM

## 2020-07-20 NOTE — Telephone Encounter (Signed)
Mom reports feeling emotional. EPDS = 14. Physically no concerns. Pregnancy loss at [redacted] weeks gestation. Order for [facility] sent to Dr. Vergie Living. Duffy Rhody, RN 07/20/2020 at 4:00pm

## 2020-07-27 ENCOUNTER — Telehealth (HOSPITAL_COMMUNITY): Payer: Self-pay | Admitting: *Deleted

## 2020-07-27 NOTE — Telephone Encounter (Signed)
Mom reports she's feeling about the same. She has not received a call from [facility] since referral sent last week. Reports she might not want to have an appointment with Behavioral Health, but she's not sure why. I encouraged her to talk with scheduler when called about appointment and how it could be beneficial to her emotionally after losing this pregnancy.  Duffy Rhody, RN 07/27/2020 at 1:45pm

## 2020-08-08 ENCOUNTER — Encounter: Payer: Self-pay | Admitting: Obstetrics & Gynecology

## 2020-08-08 ENCOUNTER — Other Ambulatory Visit: Payer: Self-pay

## 2020-08-08 ENCOUNTER — Ambulatory Visit (INDEPENDENT_AMBULATORY_CARE_PROVIDER_SITE_OTHER): Payer: Self-pay | Admitting: Obstetrics & Gynecology

## 2020-08-08 VITALS — BP 126/104 | HR 79 | Ht 64.0 in | Wt 271.9 lb

## 2020-08-08 DIAGNOSIS — Z8759 Personal history of other complications of pregnancy, childbirth and the puerperium: Secondary | ICD-10-CM | POA: Insufficient documentation

## 2020-08-08 DIAGNOSIS — Z1331 Encounter for screening for depression: Secondary | ICD-10-CM

## 2020-08-08 HISTORY — DX: Personal history of other complications of pregnancy, childbirth and the puerperium: Z87.59

## 2020-08-08 NOTE — Progress Notes (Signed)
Subjective:     Angela Hammond is a 25 y.o. female who presents for a postpartum visit. She is 4 weeks postpartum following a SAb at 19 weeks, no prenatal care. I have fully reviewed the prenatal and intrapartum course. The delivery was at 19 gestational weeks. Outcome: spontaneous abortion. Anesthesia: none. Postpartum course has been normal.Bleeding no bleeding. Bowel function is normal. Bladder function is normal. Patient is not sexually active. Contraception method is  unsure . Postpartum depression screening: positive.  The following portions of the patient's history were reviewed and updated as appropriate: allergies, current medications, past family history, past medical history, past social history, past surgical history, and problem list.  Review of Systems Pertinent items are noted in HPI.   Objective:    BP (!) 126/104   Pulse 79   Ht 5\' 4"  (1.626 m)   Wt 271 lb 14.4 oz (123.3 kg)   LMP 02/25/2020 (Approximate) Comment: SAB  BMI 46.67 kg/m   General:  alert, cooperative, and no distress           Abdomen: None distended   Vulva:  not evaluated  Vagina: not evaluated                    Assessment:    4 week second trimester loss postpartum exam. Pap smear not done at today's visit.   Plan:    1. Contraception:  counseled and I gave information 2. Needs routine Gyn f/u, never had a pap 3. Follow up in: 3 months or as needed.F/U with integrated Behavioral Health  04/24/2020, MD     Patient ID: Angela Hammond, female   DOB: 1995/06/18, 25 y.o.   MRN: [id number]

## 2020-08-08 NOTE — Progress Notes (Signed)
Pt scored 11 on PHQ-9 and 11 on Gad7

## 2020-08-08 NOTE — Patient Instructions (Signed)
Miscarriage A miscarriage is the loss of a pregnancy before the 20th week of pregnancy.Sometimes, a pregnancy ends before a woman knows that she is pregnant. If you lose a pregnancy, talk with your doctor about: Questions you have about the loss of your baby. How to work through your grief. Plans for future pregnancy. What are the causes? Many times, the cause of this condition is not known. What increases the risk? These things may make a pregnant woman more likely to lose a pregnancy: Certain health conditions Conditions that affect hormones, such as: Thyroid disease. Polycystic ovary syndrome. Diabetes. A disease that causes the body's disease-fighting system to attack itself by mistake. Infections. Bleeding problems. Being very overweight. Lifestyle factors Using products that have tobacco or nicotine in them. Being around tobacco smoke. Having alcohol. Having a lot of caffeine. Using drugs. Problems with reproductive organs or parts Having a cervix that opens and thins before your due date. The cervix is the lowest part of your womb. Having Asherman syndrome, which leads to: Scars in the womb. The womb being abnormal in shape. Growths (fibroids) in the womb. Problems in the body that are present at birth. Infection of the cervix or womb. Personal or health history Injury. Having lost a pregnancy before. Being younger than age 18 or older than age 35. Being around a harmful substance, such as radiation. Having lead or other heavy metals in: Things you eat or drink. The air around you. Using certain medicines. What are the signs or symptoms? Blood or spots of blood coming from the vagina. You may also have cramps or pain. Pain or cramps in the belly or low back. Fluid or tissue coming out of the vagina. How is this treated? Sometimes, treatment is not needed. If you need treatment, you may be treated with: A procedure to open the cervix more and take tissue out of  the womb. Medicines. You may get a shot of medicine called Rho(D) immune globulin. Follow these instructions at home: Medicines Take over-the-counter and prescription medicines only as told by your doctor. If you were prescribed antibiotic medicine, take it as told by your doctor. Do not stop taking it even if you start to feel better. Activity Rest as told by your doctor. Ask your doctor what activities are safe for you. Have someone help you at home during this time. General instructions  Watch how much tissue comes out of the vagina. Watch the size of any blood clots that come out of the vagina. Do not have sex or douche until your doctor says it is okay. Do not put things, such as tampons, in your vagina until your doctor says it is okay. To help you and your partner with grieving: Talk with your doctor. See a counselor. When you are ready, talk with your doctor about: Things to do for your health. How you can be healthy if you get pregnant again. Keep all follow-up visits.  Where to find more information The American College of Obstetricians and Gynecologists: acog.org [place] Department of Health and Human Services Office of Women's Health: hrsa.gov/office-womens-health Contact a doctor if: You have a fever or chills. There is bad-smelling fluid coming from the vagina. You have more bleeding. Tissue or clots of blood come out of your vagina. Get help right away if: You have very bad cramps or pain in your back or belly. You soak more than 2 large pads in an hour for more than 2 hours. You get light-headed or weak. You faint.   You feel sad, and you have sad thoughts a lot of the time. You think about hurting yourself. Get help right awayif you feel like you may hurt yourself or others, or have thoughts about taking your own life. Go to your nearest emergency room or: Call your local emergency services (911 in the [place] Call the National Suicide Prevention Lifeline at  1-800-273-8255. This is open 24 hours a day. Text the Crisis Text Line at 741741. Summary A miscarriage is the loss of a pregnancy before the 20th week of pregnancy. Sometimes, a pregnancy ends before a woman knows that she is pregnant. Follow instructions from your doctor about medicines and activity. To help you and your partner with grieving, talk with your doctor or a counselor. Keep all follow-up visits. This information is not intended to replace advice given to you by your health care provider. Make sure you discuss any questions you have with your healthcare provider. Document Revised: 07/03/2019 Document Reviewed: 07/03/2019 Elsevier Patient Education  2022 Elsevier Inc.  

## 2020-08-11 NOTE — BH Specialist Note (Signed)
Integrated Behavioral Health via Telemedicine Visit  08/11/2020 Angela Hammond [id number]  Number of Integrated Behavioral Health visits: 1 Session Start time: 8:22  Session End time: 8:37 Total time: 15  Referring Provider: Scheryl Darter, MD Patient/Family location: Home [facility] Provider location: [facility]  All persons participating in visit: [name] and [facility] Angela Hammond   Types of Service: Individual psychotherapy  I connected with Angela Hammond and/or Angela Hammond's  n/a  via  Telephone or Video Enabled Telemedicine Application  (Video is Caregility application) and verified that I am speaking with the correct person using two identifiers. Discussed confidentiality: Yes   I discussed the limitations of telemedicine and the availability of in person appointments.  Discussed there is a possibility of technology failure and discussed alternative modes of communication if that failure occurs.  I discussed that engaging in this telemedicine visit, they consent to the provision of behavioral healthcare and the services will be billed under their insurance.  Patient and/or legal guardian expressed understanding and consented to Telemedicine visit: Yes   Presenting Concerns: Patient and/or family reports the following symptoms/concerns: Pt states her primary concern is grieving loss of pregnancy; boyfriend is her greatest support; coping by switching jobs to less stressful work environment.  Duration of problem: Less than 2 months; Severity of problem: moderate  Patient and/or Family's Strengths/Protective Factors: Social connections and Concrete supports in place (healthy food, safe environments, etc.)  Goals Addressed: Patient will:  Reduce symptoms of: anxiety, depression, and stress   Increase knowledge and/or ability of: healthy habits and stress reduction   Demonstrate ability to: Increase adequate support  systems for patient/family  Progress towards Goals: Ongoing  Interventions: Interventions utilized:  Psychoeducation and/or Health Education, Link to [employer], and Supportive Reflection Standardized Assessments completed: Not Needed  Patient and/or Family Response: Pt agrees with treatment plan  Assessment: Patient currently experiencing Grief and Psychosocial stress  Patient may benefit from psychoeducation and brief therapeutic interventions regarding coping with symptoms of anxiety, depression related to recent loss, as well as life stress .  Plan: Follow up with behavioral health clinician on : Angela Hammond will call in one week; call Angela Hammond as needed at [phone] Behavioral recommendations:  -Consider continuing to take prenatal vitamins for remainder of bottle, for overall wellness -Consider registering for pregnancy loss support group of choice, at www.postpartum.net  -[employer] paperwork will be sent to you. Fill out and bring back to [facility]'s front desk Referral(s): Integrated Art gallery manager (In Clinic) and [employer]:  Loss support  I discussed the assessment and treatment plan with the patient and/or parent/guardian. They were provided an opportunity to ask questions and all were answered. They agreed with the plan and demonstrated an understanding of the instructions.   They were advised to call back or seek an in-person evaluation if the symptoms worsen or if the condition fails to improve as anticipated.  Angela Hammond Angela Ohlinger, LCSW

## 2020-08-25 ENCOUNTER — Encounter: Payer: Self-pay | Admitting: Family Medicine

## 2020-08-25 ENCOUNTER — Ambulatory Visit: Payer: Self-pay | Admitting: Clinical

## 2020-08-25 DIAGNOSIS — F4321 Adjustment disorder with depressed mood: Secondary | ICD-10-CM

## 2020-08-25 DIAGNOSIS — Z658 Other specified problems related to psychosocial circumstances: Secondary | ICD-10-CM

## 2020-08-25 NOTE — Patient Instructions (Signed)
[facility] [address] [place], [place] [address] [phone] (main office) [phone] (Elizeo Rodriques's office)  Pregnancy Loss online support group:  www.[email or web address].net (Click on "Get Help" tab, then "Online Support Group" tab; scroll down to pregnancy loss groups)

## 2021-01-24 IMAGING — US US PELVIS COMPLETE TRANSABD/TRANSVAG W DUPLEX
1 series · 13 of 25 positions shown · non-contrast
Comparison: None.

CLINICAL DATA: 23-year-old female with pelvic pain and irregular
bleeding for 1 day



[Series 1: us pelvis complete transabd/transvag w duplex · 13 of 77 slices shown]
[im 1/77]
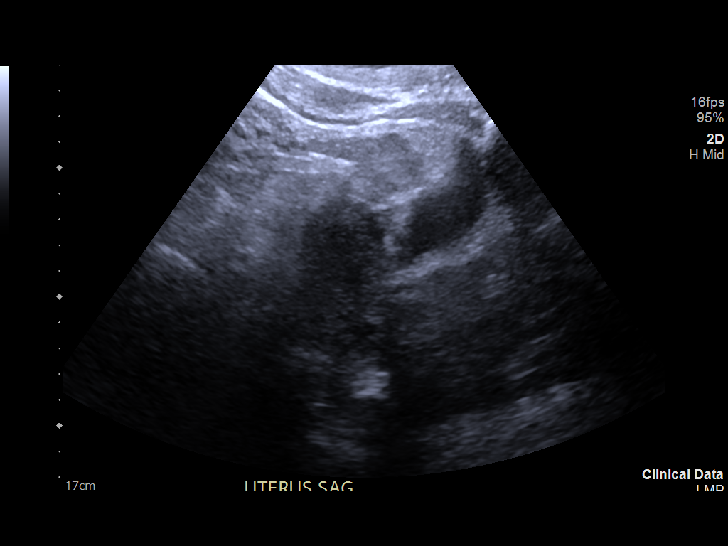
[im 7/77]
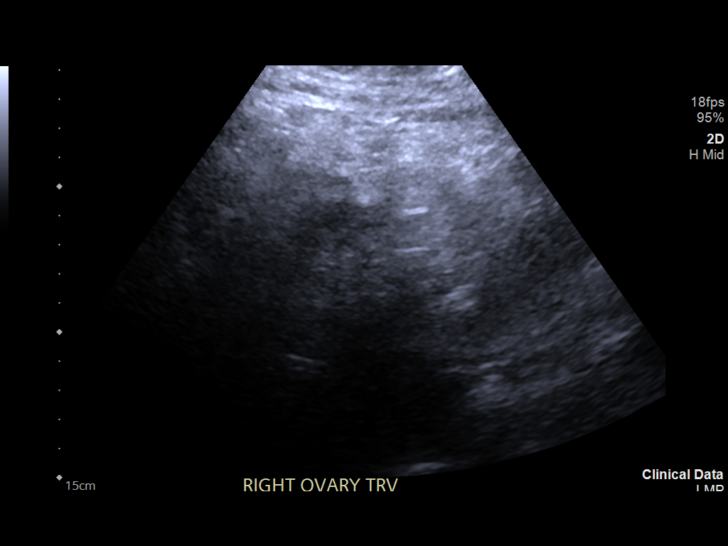
[im 13/77]
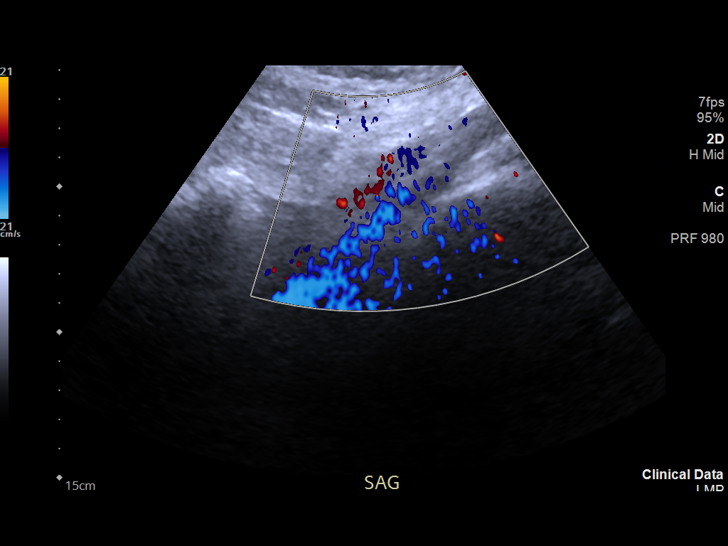
[im 20/77]
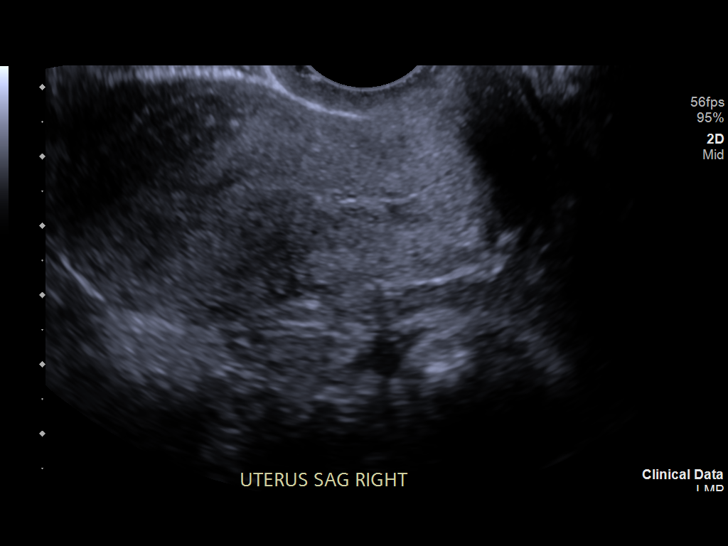
[im 26/77]
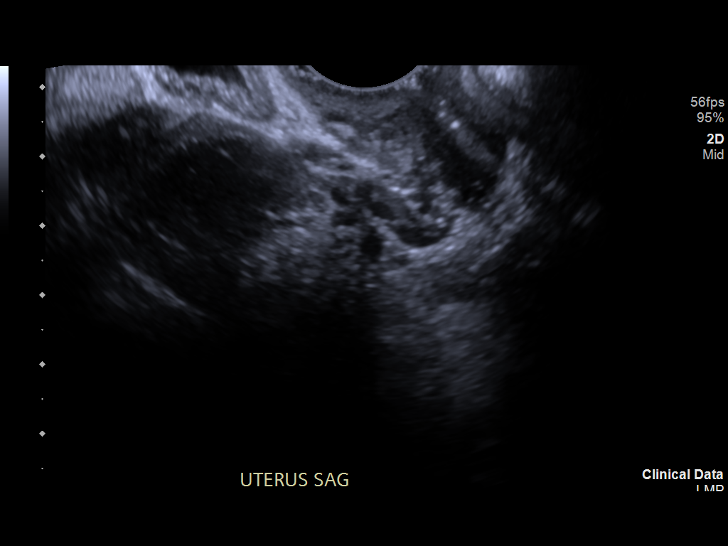
[im 32/77]
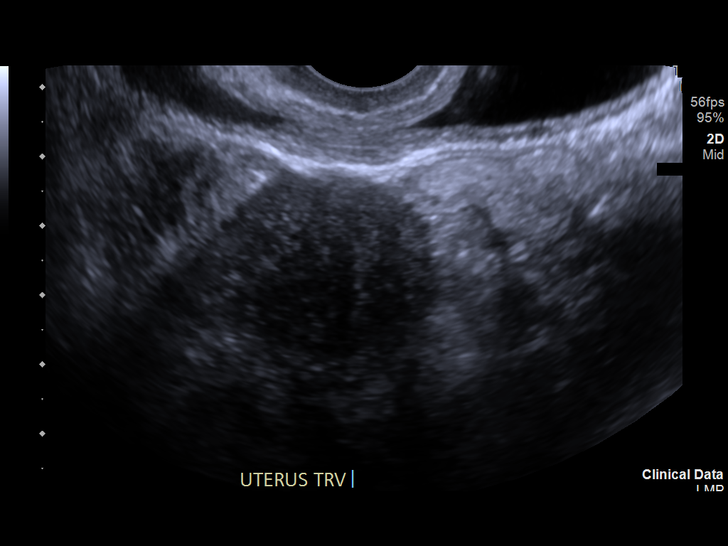
[im 39/77]
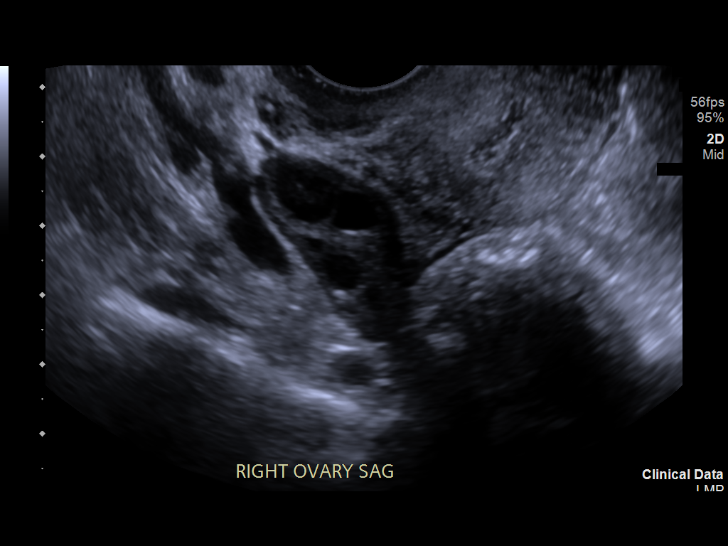
[im 45/77]
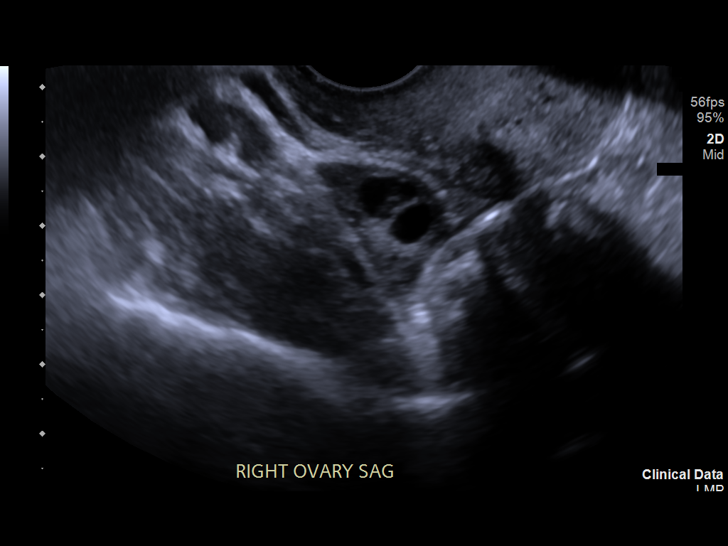
[im 51/77]
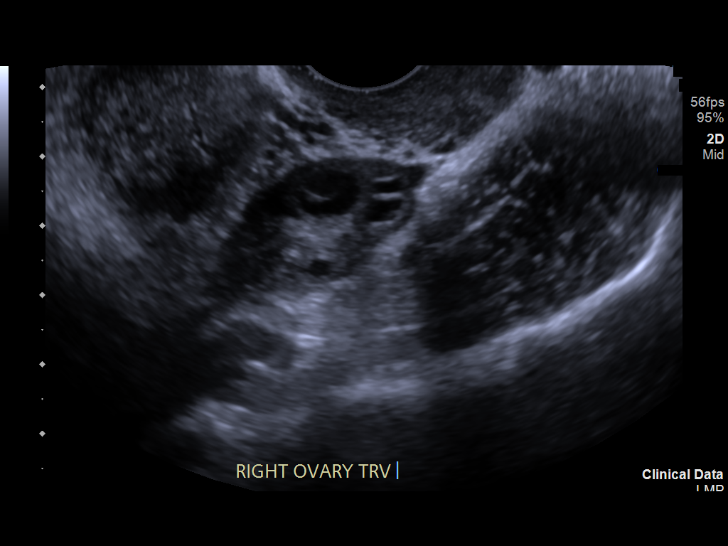
[im 58/77]
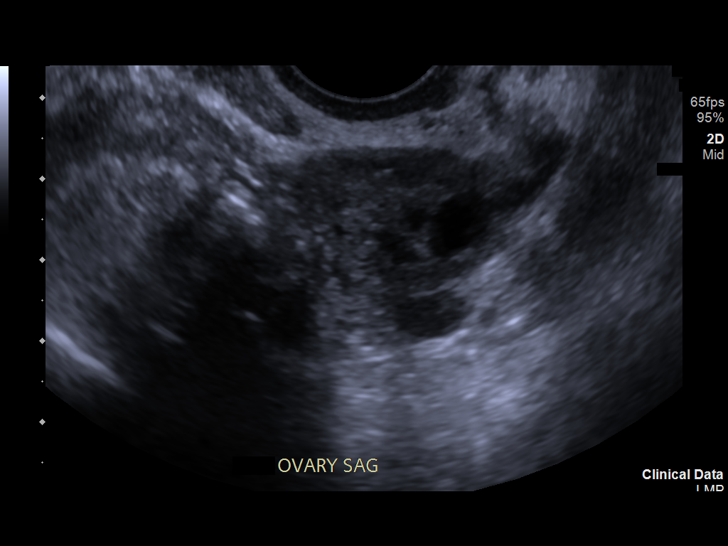
[im 64/77]
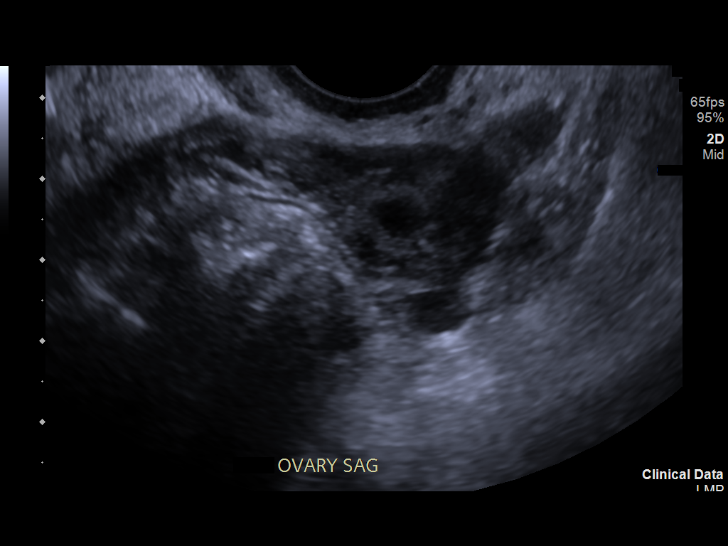
[im 70/77]
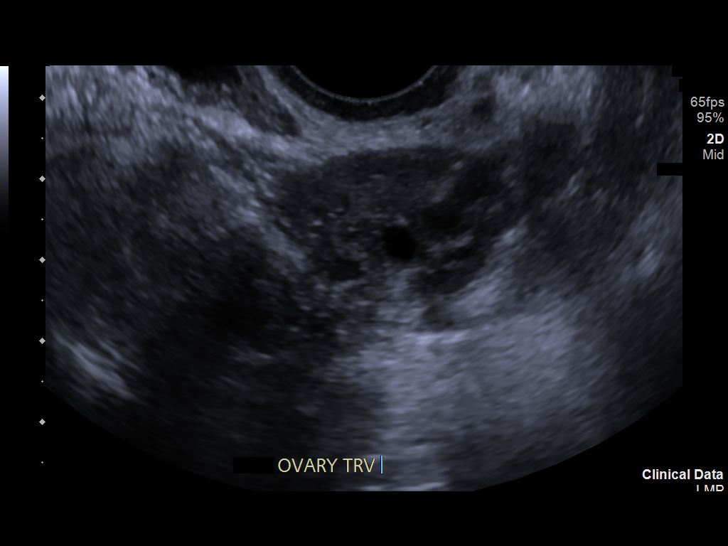
[im 77/77]
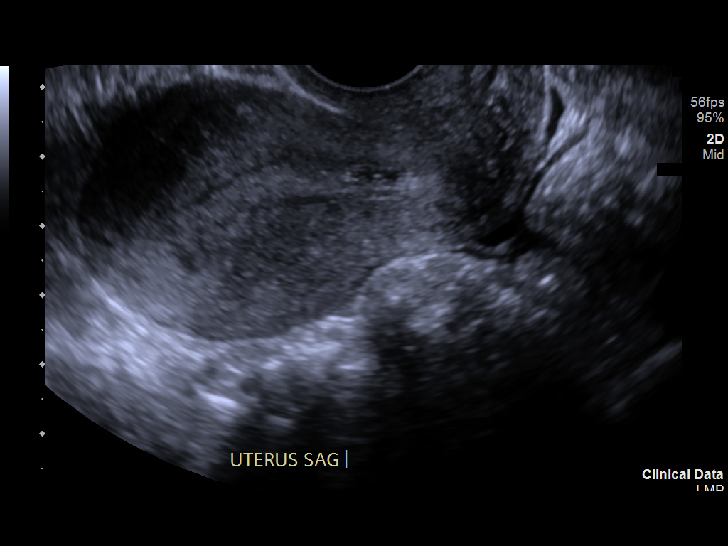

[13 of 25 positions shown; findings below may reference images not displayed]

FINDINGS: Uterus

Measurements: 7 x 3.5 x 4.1 cm = volume: 54 mL. No fibroids or other
mass visualized.

Endometrium

Thickness: 7 mm.  No focal abnormality visualized.

Right ovary

Measurements: 3.1 x 1.5 x 2.3 cm = volume: 5.8 mL. Normal
appearance/no adnexal mass.

Left ovary

Measurements: 2.7 x 1.9 x 2.2 cm = volume: 6 mL. Normal
appearance/no adnexal mass.

Pulsed Doppler evaluation of both ovaries demonstrates normal
low-resistance arterial and venous waveforms.

Other findings

No abnormal free fluid.
IMPRESSION: Unremarkable pelvic ultrasound. Normal ovaries. No evidence of
ovarian torsion.

## 2021-02-25 ENCOUNTER — Other Ambulatory Visit: Payer: Self-pay

## 2021-02-25 ENCOUNTER — Emergency Department (HOSPITAL_COMMUNITY): Payer: Medicaid Other

## 2021-02-25 ENCOUNTER — Encounter (HOSPITAL_COMMUNITY): Payer: Self-pay

## 2021-02-25 ENCOUNTER — Emergency Department (HOSPITAL_COMMUNITY)
Admission: EM | Admit: 2021-02-25 | Discharge: 2021-02-25 | Disposition: A | Discharge status: Home / Self Care | Payer: Medicaid Other | Attending: Emergency Medicine | Admitting: Emergency Medicine

## 2021-02-25 DIAGNOSIS — M722 Plantar fascial fibromatosis: Secondary | ICD-10-CM | POA: Insufficient documentation

## 2021-02-25 DIAGNOSIS — M79671 Pain in right foot: Secondary | ICD-10-CM | POA: Diagnosis present

## 2021-02-25 MED ORDER — NAPROXEN 500 MG PO TABS: 500.0000 mg | ORAL_TABLET | Freq: Two times a day (BID) | ORAL | 0 refills | Status: DC

## 2021-02-25 NOTE — ED Triage Notes (Signed)
Right heel pain x 1 month. Denies injury, pain worse with ambulation

## 2021-02-25 NOTE — ED Provider Notes (Addendum)
[facility] Provider Note   CSN: [id number] Arrival date & time: 02/25/21  1313     History  No chief complaint on file.   Angela Hammond is a 26 y.o. female.      26 year old female presents today complaining of right heel pain.  Pain has worsened over the past month.  It is worse in the morning and then has some improvement although continues to cause her pain when she is walking around and doing her usual daily activities and work.  She denies any direct trauma or injury.  She has not had any redness swelling or proximal pain.  She has not had similar symptoms in the past.  She has been using a heel cup pad that she ordered online.    Home Medications Prior to Admission medications   Medication Sig Start Date End Date Taking? Authorizing Provider  naproxen (NAPROSYN) 500 MG tablet Take 1 tablet (500 mg total) by mouth 2 (two) times daily. 02/25/21  Yes Aberman, Caroline C, PA-C  ibuprofen (ADVIL) 600 MG tablet Take 1 tablet (600 mg total) by mouth every 6 (six) hours. 07/09/20   Julianne Handler, CNM  dicyclomine (BENTYL) 20 MG tablet Take 1 tablet (20 mg total) by mouth 2 (two) times daily as needed for spasms. 03/17/18 01/27/19  Caccavale, Sophia, PA-C  fluticasone (FLONASE) 50 MCG/ACT nasal spray Place 1 spray into both nostrils daily. 10/12/17 01/27/19  Frederica Kuster, PA-C      Allergies    Patient has no known allergies.    Review of Systems   Review of Systems  All other systems reviewed and are negative.  Physical Exam Updated Vital Signs BP (!) 135/101    Pulse 75    Temp 98 F (36.7 C) (Oral)    Resp 18    SpO2 100%  Physical Exam Vitals and nursing note reviewed.  HENT:     Head: Normocephalic.     Right Ear: External ear normal.     Left Ear: External ear normal.     Nose: Nose normal.     Mouth/Throat:     Pharynx: Oropharynx is clear.  Cardiovascular:     Rate and Rhythm: Normal rate.  Pulmonary:     Effort:  Pulmonary effort is normal.  Musculoskeletal:        General: Normal range of motion.     Cervical back: Normal range of motion.     Comments: Right foot is visually examined and no obvious external signs of trauma or abnormality are noted DP pulses intact PT pulses intact There is no redness or warmth noted There is some tenderness along the medial aspect of the plantar surface of the calcaneus Patient has full active range of motion of ankle foot and toes   Neurological:     Mental Status: She is alert.    ED Results / Procedures / Treatments   Labs (all labs ordered are listed, but only abnormal results are displayed) Labs Reviewed - No data to display  EKG None  Radiology DG Foot Complete Right  Result Date: 02/25/2021 CLINICAL DATA:  Right foot pain, no known injury EXAM: RIGHT FOOT COMPLETE - 3+ VIEW COMPARISON:  None. FINDINGS: There is no evidence of fracture or dislocation. There is no evidence of arthropathy or other focal bone abnormality. Soft tissues are unremarkable. IMPRESSION: Negative. Electronically Signed   By: Audie Pinto M.D.   On: 02/25/2021 14:11    Procedures Procedures  Medications Ordered in ED Medications - No data to display  ED Course/ Medical Decision Making/ A&P Clinical Course as of 02/25/21 1428  Sat Feb 25, 2021  1412 Right foot x-Donyetta Ogletree personally reviewed and interpreted without any evidence of acute abnormality Awaiting radiology interpretation [DR]    Clinical Course User Index [DR] Pattricia Boss, MD                           Medical Decision Making Amount and/or Complexity of Data Reviewed Radiology: ordered.  Risk Prescription drug management.  Final Clinical Impression(s) / ED Diagnoses Final diagnoses:  Right foot pain  Plantar fasciitis of right foot    Rx / DC Orders ED Discharge Orders          Ordered    naproxen (NAPROSYN) 500 MG tablet  2 times daily        02/25/21 1401              Pattricia Boss, MD 02/25/21 1426    Pattricia Boss, MD 02/25/21 1428

## 2021-02-25 NOTE — Discharge Instructions (Addendum)
Please continue ibuprofen 600 mg 3 times a day for 1 week as discussed Continue using your heel pad Call Dr. Logan Bores office Monday for recheck if continued pain

## 2021-03-24 ENCOUNTER — Other Ambulatory Visit: Payer: Self-pay

## 2021-03-24 ENCOUNTER — Emergency Department (HOSPITAL_COMMUNITY)
Admission: EM | Admit: 2021-03-24 | Discharge: 2021-03-24 | Disposition: A | Discharge status: Home / Self Care | Payer: Medicaid Other | Attending: Emergency Medicine | Admitting: Emergency Medicine

## 2021-03-24 ENCOUNTER — Encounter (HOSPITAL_COMMUNITY): Payer: Self-pay | Admitting: Emergency Medicine

## 2021-03-24 DIAGNOSIS — K0889 Other specified disorders of teeth and supporting structures: Secondary | ICD-10-CM | POA: Diagnosis present

## 2021-03-24 MED ORDER — CLINDAMYCIN HCL 300 MG PO CAPS: 300.0000 mg | ORAL_CAPSULE | Freq: Three times a day (TID) | ORAL | 0 refills | Status: AC

## 2021-03-24 NOTE — ED Triage Notes (Signed)
Pt coming from home, complaint of dental pain that is ongoing. VSS. NAD. ?

## 2021-03-24 NOTE — ED Provider Notes (Signed)
?[facility] EMERGENCY DEPARTMENT ?Provider Note ? ? ?CSN: [id number] ?Arrival date & time: 03/24/21  0973 ? ?  ? ?History ? ?Chief Complaint  ?Patient presents with  ? Dental Pain  ? ? ?Angela Hammond is a 26 y.o. female. ? ?The history is provided by the patient.  ?Dental Pain ?Location:  Lower ?Quality:  Aching ?Severity:  Mild ?Onset quality:  Gradual ?Timing:  Intermittent ?Progression:  Waxing and waning ?Chronicity:  New ?Context: dental caries   ?Relieved by:  Nothing ?Worsened by:  Nothing ?Associated symptoms: no congestion, no difficulty swallowing, no drooling, no facial pain, no facial swelling, no fever, no gum swelling, no headaches, no neck pain, no neck swelling, no oral bleeding, no oral lesions and no trismus   ?Risk factors: lack of dental care   ? ?  ? ?Home Medications ?Prior to Admission medications   ?Medication Sig Start Date End Date Taking? Authorizing Provider  ?clindamycin (CLEOCIN) 300 MG capsule Take 1 capsule (300 mg total) by mouth 3 (three) times daily for 7 days. 03/24/21 03/31/21 Yes Teven Mittman, DO  ?ibuprofen (ADVIL) 600 MG tablet Take 1 tablet (600 mg total) by mouth every 6 (six) hours. 07/09/20   Donette Larry, CNM  ?naproxen (NAPROSYN) 500 MG tablet Take 1 tablet (500 mg total) by mouth 2 (two) times daily. 02/25/21   Mannie Stabile, PA-C  ?dicyclomine (BENTYL) 20 MG tablet Take 1 tablet (20 mg total) by mouth 2 (two) times daily as needed for spasms. 03/17/18 01/27/19  Caccavale, Sophia, PA-C  ?fluticasone (FLONASE) 50 MCG/ACT nasal spray Place 1 spray into both nostrils daily. 10/12/17 01/27/19  Emi Holes, PA-C  ?   ? ?Allergies    ?Patient has no known allergies.   ? ?Review of Systems   ?Review of Systems  ?Constitutional:  Negative for fever.  ?HENT:  Negative for congestion, drooling, facial swelling and mouth sores.   ?Musculoskeletal:  Negative for neck pain.  ?Neurological:  Negative for headaches.  ? ?Physical Exam ?Updated Vital Signs ?BP  (!) 179/115 (BP Location: Right Arm)   Pulse 93   Temp 98.6 ?F (37 ?C) (Oral)   Resp 14   SpO2 100%  ?Physical Exam ?Constitutional:   ?   General: She is not in acute distress. ?   Appearance: She is not ill-appearing.  ?HENT:  ?   Head: Normocephalic.  ?   Nose: Nose normal.  ?   Mouth/Throat:  ?   Mouth: Mucous membranes are moist.  ?   Comments: Overall there is no signs of major abscess in the mouth, there is no gingival swelling, no trismus, no drooling ?Eyes:  ?   Extraocular Movements: Extraocular movements intact.  ?   Pupils: Pupils are equal, round, and reactive to light.  ?Neurological:  ?   Mental Status: She is alert.  ? ? ?ED Results / Procedures / Treatments   ?Labs ?(all labs ordered are listed, but only abnormal results are displayed) ?Labs Reviewed - No data to display ? ?EKG ?None ? ?Radiology ?No results found. ? ?Procedures ?Procedures  ? ? ?Medications Ordered in ED ?Medications - No data to display ? ?ED Course/ Medical Decision Making/ A&P ?  ?                        ?Medical Decision Making ?Risk ?Prescription drug management. ? ? ?RUSHIE BRAZEL is here with dental pain.  Unremarkable vitals.  No  fever.  Fairly unremarkable dental exam.  No obvious infection or abscess.  No trismus or drooling.  Probably cavity related pain.  We will have her follow-up with dentistry.  We will conservatively give antibiotics.  Discharged in good condition. ? ?This chart was dictated using voice recognition software.  Despite best efforts to proofread,  errors can occur which can change the documentation meaning.  ? ? ? ? ? ? ? ?Final Clinical Impression(s) / ED Diagnoses ?Final diagnoses:  ?Pain, dental  ? ? ?Rx / DC Orders ?ED Discharge Orders   ? ?      Ordered  ?  clindamycin (CLEOCIN) 300 MG capsule  3 times daily       ? 03/24/21 0858  ? ?  ?  ? ?  ? ? ?  ?Virgina Norfolk, DO ?03/24/21 8250 ? ?

## 2021-04-16 ENCOUNTER — Ambulatory Visit (HOSPITAL_COMMUNITY)
Admission: EM | Admit: 2021-04-16 | Discharge: 2021-04-16 | Disposition: A | Discharge status: Home / Self Care | Payer: Medicaid Other | Attending: Nurse Practitioner | Admitting: Nurse Practitioner

## 2021-04-16 ENCOUNTER — Encounter (HOSPITAL_COMMUNITY): Payer: Self-pay | Admitting: Emergency Medicine

## 2021-04-16 DIAGNOSIS — Z202 Contact with and (suspected) exposure to infections with a predominantly sexual mode of transmission: Secondary | ICD-10-CM | POA: Diagnosis not present

## 2021-04-16 DIAGNOSIS — Z113 Encounter for screening for infections with a predominantly sexual mode of transmission: Secondary | ICD-10-CM | POA: Diagnosis present

## 2021-04-16 LAB — POCT URINALYSIS DIPSTICK, ED / UC
Bilirubin Urine: NEGATIVE
Glucose, UA: NEGATIVE mg/dL
Hgb urine dipstick: NEGATIVE
Leukocytes,Ua: NEGATIVE
Nitrite: NEGATIVE
Protein, ur: NEGATIVE mg/dL
Specific Gravity, Urine: 1.025 (ref 1.005–1.030)
Urobilinogen, UA: 0.2 mg/dL (ref 0.0–1.0)
pH: 6 (ref 5.0–8.0)

## 2021-04-16 NOTE — Discharge Instructions (Addendum)
Your urine pregnancy and urinalysis are negative today. ?You will be contacted if your cytology results are positive.  If you have access to MyChart, you will also be able to see your results there. ?Consider increasing her condom use and some form of birth control. ?Follow-up as needed. ?

## 2021-04-16 NOTE — ED Triage Notes (Signed)
Pt requesting STD testing. Denies s/s or exposures.  ?

## 2021-04-16 NOTE — ED Provider Notes (Addendum)
?[facility] ? ? ? ?CSN: [id number] ?Arrival date & time: 04/16/21  1015 ? ? ?  ? ?History   ?Chief Complaint ?Chief Complaint  ?Patient presents with  ? SEXUALLY TRANSMITTED DISEASE  ? ? ?HPI ?Angela Hammond is a 26 y.o. female.  ? ?The patient is a 26 year old female who presents for STI testing.  She denies any symptoms to include vaginal discharge, vaginal odor, vaginal itching, urinary symptoms, or abdominal pain.  Last menstrual cycle was 03/23/2021.  She reports 2 female partners in the past 90 days with 0% condom use.  She currently is not on any count of birth control.  Has a previous history of gonorrhea. ? ?The history is provided by the patient.  ? ?Past Medical History:  ?Diagnosis Date  ? Gonorrhea 04/19/2011  ? History of gonorrhea   ? 2013  ? Morbid obesity (HCC) 04/18/2011  ? Pelvic inflammatory disease (PID) 04/18/2011  ? ? ?Patient Active Problem List  ? Diagnosis Date Noted  ? Hx of one miscarriage 08/08/2020  ? Previable preterm premature rupture of membranes at [redacted] weeks gestation 07/08/2020  ? Morbid obesity (HCC) 04/18/2011  ? ? ?Past Surgical History:  ?Procedure Laterality Date  ? TONSILLECTOMY    ? AGE 57  ? ? ?OB History   ? ? Gravida  ?2  ? Para  ?1  ? Term  ?0  ? Preterm  ?0  ? AB  ?1  ? Living  ?0  ?  ? ? SAB  ?0  ? IAB  ?0  ? Ectopic  ?0  ? Multiple  ?0  ? Live Births  ?   ?   ?  ?  ? ? ? ?Home Medications   ? ?Prior to Admission medications   ?Medication Sig Start Date End Date Taking? Authorizing Provider  ?ibuprofen (ADVIL) 600 MG tablet Take 1 tablet (600 mg total) by mouth every 6 (six) hours. 07/09/20   Donette Larry, CNM  ?naproxen (NAPROSYN) 500 MG tablet Take 1 tablet (500 mg total) by mouth 2 (two) times daily. 02/25/21   Mannie Stabile, PA-C  ?dicyclomine (BENTYL) 20 MG tablet Take 1 tablet (20 mg total) by mouth 2 (two) times daily as needed for spasms. 03/17/18 01/27/19  Caccavale, Sophia, PA-C  ?fluticasone (FLONASE) 50 MCG/ACT nasal spray Place 1 spray into both  nostrils daily. 10/12/17 01/27/19  Emi Holes, PA-C  ? ? ?Family History ?Family History  ?Problem Relation Age of Onset  ? Stroke Mother   ? Hypertension Mother   ? Hypertension Father   ? Diabetes Maternal Aunt   ? Stroke Maternal Aunt   ? Hypertension Maternal Aunt   ? Stroke Maternal Grandfather   ? Hypertension Maternal Grandfather   ? ? ?Social History ?Social History  ? ?Tobacco Use  ? Smoking status: Never  ? Smokeless tobacco: Never  ?Substance Use Topics  ? Alcohol use: Not Currently  ?  Comment: occasionally   ? Drug use: No  ? ? ? ?Allergies   ?Patient has no known allergies. ? ? ?Review of Systems ?Review of Systems  ?Constitutional: Negative.   ?Gastrointestinal: Negative.   ?Genitourinary: Negative.   ?Skin: Negative.   ?Psychiatric/Behavioral: Negative.    ? ? ?Physical Exam ?Triage Vital Signs ?ED Triage Vitals [04/16/21 1129]  ?Enc Vitals Group  ?   BP 132/82  ?   Pulse Rate 63  ?   Resp 18  ?   Temp 97.8 ?F (36.6 ?C)  ?  Temp Source Oral  ?   SpO2 100 %  ?   Weight   ?   Height   ?   Head Circumference   ?   Peak Flow   ?   Pain Score   ?   Pain Loc   ?   Pain Edu?   ?   Excl. in GC?   ? ?No data found. ? ?Updated Vital Signs ?BP 132/82 (BP Location: Right Arm)   Pulse 63   Temp 97.8 ?F (36.6 ?C) (Oral)   Resp 18   LMP 03/23/2021   SpO2 100%  ? ?Visual Acuity ?Right Eye Distance:   ?Left Eye Distance:   ?Bilateral Distance:   ? ?Right Eye Near:   ?Left Eye Near:    ?Bilateral Near:    ? ?Physical Exam ?Vitals reviewed.  ?Constitutional:   ?   General: She is not in acute distress. ?   Appearance: Normal appearance.  ?HENT:  ?   Head: Normocephalic and atraumatic.  ?Eyes:  ?   Extraocular Movements: Extraocular movements intact.  ?   Conjunctiva/sclera: Conjunctivae normal.  ?   Pupils: Pupils are equal, round, and reactive to light.  ?Cardiovascular:  ?   Rate and Rhythm: Normal rate and regular rhythm.  ?Pulmonary:  ?   Effort: Pulmonary effort is normal.  ?   Breath sounds: Normal  breath sounds.  ?Abdominal:  ?   General: Bowel sounds are normal.  ?   Palpations: Abdomen is soft.  ?Neurological:  ?   Mental Status: She is alert and oriented to person, place, and time.  ?Psychiatric:     ?   Mood and Affect: Mood normal.     ?   Behavior: Behavior normal.  ? ? ? ?UC Treatments / Results  ?Labs ?(all labs ordered are listed, but only abnormal results are displayed) ?Labs Reviewed  ?POCT URINALYSIS DIPSTICK, ED / UC - Abnormal; Notable for the following components:  ?    Result Value  ? Ketones, ur TRACE (*)   ? All other components within normal limits  ?POC URINE PREG, ED  ?CERVICOVAGINAL ANCILLARY ONLY  ? ? ?EKG ? ? ?Radiology ?No results found. ? ?Procedures ?Procedures (including critical care time) ? ?Medications Ordered in UC ?Medications - No data to display ? ?Initial Impression / Assessment and Plan / UC Course  ?I have reviewed the triage vital signs and the nursing notes. ? ?Pertinent labs & imaging results that were available during my care of the patient were reviewed by me and considered in my medical decision making (see chart for details). ? ?The patient is a 26 year old female who presents for STI testing.  Negative urinalysis and urine pregnancy test. Patient advised that she will be contacted if her cytology results are positive to discuss treatment.  Her urine pregnancy and urinalysis are negative today.  Follow-up as needed. ? ?Final Clinical Impressions(s) / UC Diagnoses  ? ?Final diagnoses:  ?Screening for STDs (sexually transmitted diseases)  ? ? ? ?Discharge Instructions   ? ?  ?Your urine pregnancy and urinalysis are negative today. ?You will be contacted if your cytology results are positive.  If you have access to MyChart, you will also be able to see your results there. ?Consider increasing her condom use and some form of birth control. ?Follow-up as needed. ? ? ? ? ?ED Prescriptions   ?None ?  ? ?PDMP not reviewed this encounter. ?  ?Abran Cantor,  NP ?04/16/21  1226 ? ?  ?Abran CantorLeath-Warren, Christipher Rieger J, NP ?04/16/21 1229 ? ?

## 2021-04-17 LAB — CERVICOVAGINAL ANCILLARY ONLY
Bacterial Vaginitis (gardnerella): POSITIVE — AB
Candida Glabrata: NEGATIVE
Candida Vaginitis: NEGATIVE
Chlamydia: NEGATIVE
Comment: NEGATIVE
Comment: NEGATIVE
Comment: NEGATIVE
Comment: NEGATIVE
Comment: NEGATIVE
Comment: NORMAL
Neisseria Gonorrhea: NEGATIVE
Trichomonas: NEGATIVE

## 2021-04-18 ENCOUNTER — Telehealth (HOSPITAL_COMMUNITY): Payer: Self-pay | Admitting: Emergency Medicine

## 2021-04-18 MED ORDER — METRONIDAZOLE 0.75 % VA GEL: 1.0000 | Freq: Every day | VAGINAL | 0 refills | Status: AC

## 2021-06-13 ENCOUNTER — Inpatient Hospital Stay (HOSPITAL_COMMUNITY)
Admission: AD | Admit: 2021-06-13 | Discharge: 2021-06-13 | Disposition: A | Discharge status: Home / Self Care | Payer: Medicaid Other | Attending: Obstetrics & Gynecology | Admitting: Obstetrics & Gynecology

## 2021-06-13 ENCOUNTER — Inpatient Hospital Stay (HOSPITAL_COMMUNITY): Payer: Medicaid Other

## 2021-06-13 ENCOUNTER — Encounter (HOSPITAL_COMMUNITY): Payer: Self-pay | Admitting: *Deleted

## 2021-06-13 ENCOUNTER — Other Ambulatory Visit: Payer: Self-pay

## 2021-06-13 DIAGNOSIS — Z3A01 Less than 8 weeks gestation of pregnancy: Secondary | ICD-10-CM

## 2021-06-13 DIAGNOSIS — B9689 Other specified bacterial agents as the cause of diseases classified elsewhere: Secondary | ICD-10-CM | POA: Insufficient documentation

## 2021-06-13 DIAGNOSIS — O26899 Other specified pregnancy related conditions, unspecified trimester: Secondary | ICD-10-CM

## 2021-06-13 DIAGNOSIS — O26891 Other specified pregnancy related conditions, first trimester: Secondary | ICD-10-CM | POA: Diagnosis present

## 2021-06-13 DIAGNOSIS — Z3491 Encounter for supervision of normal pregnancy, unspecified, first trimester: Secondary | ICD-10-CM | POA: Diagnosis not present

## 2021-06-13 DIAGNOSIS — R109 Unspecified abdominal pain: Secondary | ICD-10-CM | POA: Insufficient documentation

## 2021-06-13 DIAGNOSIS — O23591 Infection of other part of genital tract in pregnancy, first trimester: Secondary | ICD-10-CM | POA: Diagnosis not present

## 2021-06-13 HISTORY — DX: Other specified health status: Z78.9

## 2021-06-13 LAB — URINALYSIS, ROUTINE W REFLEX MICROSCOPIC
Bilirubin Urine: NEGATIVE
Glucose, UA: NEGATIVE mg/dL
Hgb urine dipstick: NEGATIVE
Ketones, ur: NEGATIVE mg/dL
Nitrite: NEGATIVE
Protein, ur: NEGATIVE mg/dL
Specific Gravity, Urine: 1.023 (ref 1.005–1.030)
pH: 5 (ref 5.0–8.0)

## 2021-06-13 LAB — WET PREP, GENITAL
Sperm: NONE SEEN
Trich, Wet Prep: NONE SEEN
WBC, Wet Prep HPF POC: >=10 — AB (ref <10)
Yeast Wet Prep HPF POC: NONE SEEN

## 2021-06-13 LAB — CBC
HCT: 35.6 % — ABNORMAL LOW (ref 36.0–46.0)
Hemoglobin: 11.1 g/dL — ABNORMAL LOW (ref 12.0–15.0)
MCH: 24.7 pg — ABNORMAL LOW (ref 26.0–34.0)
MCHC: 31.2 g/dL (ref 30.0–36.0)
MCV: 79.1 fL — ABNORMAL LOW (ref 80.0–100.0)
Platelets: 212 10*3/uL (ref 150–400)
RBC: 4.5 MIL/uL (ref 3.87–5.11)
RDW: 15.9 % — ABNORMAL HIGH (ref 11.5–15.5)
WBC: 5.5 10*3/uL (ref 4.0–10.5)
nRBC: 0 % (ref 0.0–0.2)

## 2021-06-13 LAB — HCG, QUANTITATIVE, PREGNANCY: hCG, Beta Chain, Quant, S: 22343 m[IU]/mL — ABNORMAL HIGH (ref <5)

## 2021-06-13 LAB — POCT PREGNANCY, URINE: Preg Test, Ur: POSITIVE — AB

## 2021-06-13 MED ORDER — METRONIDAZOLE 500 MG PO TABS: 500.0000 mg | ORAL_TABLET | Freq: Two times a day (BID) | ORAL | 0 refills | Status: DC

## 2021-06-13 MED ORDER — METRONIDAZOLE 0.75 % VA GEL: 1.0000 | Freq: Two times a day (BID) | VAGINAL | 0 refills | Status: DC

## 2021-06-13 NOTE — MAU Note (Signed)
MELYSA SCHROYER is a 26 y.o. at 7wks 0d here in MAU reporting: abdominal cramping & pink discharge.  Reports cramping was initially occasional but are now more regular.  Also reports discharge has foul odor.  Reports also been having headaches, took Tylenol, but not since last week or week before. LMP: 04/25/2021 Onset of complaint: unsure Pain score: 6/10 Vitals:   06/13/21 1030  BP: 131/82  Pulse: 79  Resp: 19  Temp: 98.5 F (36.9 C)  SpO2: 100%     FHT:N/A Lab orders placed from triage:   UPT

## 2021-06-13 NOTE — Progress Notes (Signed)
GC/Chlamydia & Wet Prep cultures obtained via pt self swabbing.

## 2021-06-13 NOTE — Discharge Instructions (Signed)

## 2021-06-13 NOTE — MAU Provider Note (Cosign Needed Addendum)
History     CSN: [id number]  Arrival date and time: 06/13/21 0946   Event Date/Time   First Provider Initiated Contact with Patient 06/13/21 1047      Chief Complaint  Patient presents with   Cramping   Vaginal Discharge   HPI  Angela Hammond is a 26 y.o. G2P0000 at [redacted]w[redacted]d who presents for evaluation of abdominal cramping. Patient reports she has been having cramping since she found out she was pregnant. She states it's not worse, but persistent so she felt like she should be seen. Patient rates the pain as a 7/10 and has not tried anything for the pain. She also reports a foul smelling discharge. She denies any vaginal bleeding and leaking of fluid. Denies any constipation, diarrhea or any urinary complaints.    OB History     Gravida  2   Para  1   Term  0   Preterm  0   AB  0   Living  0      SAB  0   IAB  0   Ectopic  0   Multiple  0   Live Births              Past Medical History:  Diagnosis Date   Gonorrhea 04/19/2011   History of gonorrhea    2013   Medical history non-contributory    Morbid obesity (HCC) 04/18/2011   Pelvic inflammatory disease (PID) 04/18/2011    Past Surgical History:  Procedure Laterality Date   TONSILLECTOMY     AGE 31    Family History  Problem Relation Age of Onset   Stroke Mother    Hypertension Mother    Hypertension Father    Diabetes Maternal Aunt    Stroke Maternal Aunt    Hypertension Maternal Aunt    Stroke Maternal Grandfather    Hypertension Maternal Grandfather     Social History   Tobacco Use   Smoking status: Never   Smokeless tobacco: Never  Vaping Use   Vaping Use: Never used  Substance Use Topics   Alcohol use: Not Currently    Comment: occasionally    Drug use: No    Allergies: No Known Allergies  Medications Prior to Admission  Medication Sig Dispense Refill Last Dose   ibuprofen (ADVIL) 600 MG tablet Take 1 tablet (600 mg total) by mouth every 6 (six) hours. 30 tablet 0     naproxen (NAPROSYN) 500 MG tablet Take 1 tablet (500 mg total) by mouth 2 (two) times daily. 30 tablet 0     Review of Systems  Constitutional: Negative.  Negative for fatigue and fever.  HENT: Negative.    Respiratory: Negative.  Negative for shortness of breath.   Cardiovascular: Negative.  Negative for chest pain.  Gastrointestinal:  Positive for abdominal pain. Negative for constipation, diarrhea, nausea and vomiting.  Genitourinary:  Positive for vaginal discharge. Negative for dysuria and vaginal bleeding.  Neurological: Negative.  Negative for dizziness and headaches.  Physical Exam   Blood pressure 131/82, pulse 79, temperature 98.5 F (36.9 C), temperature source Oral, resp. rate 19, height 5\' 4"  (1.626 m), weight 118 kg, last menstrual period 04/25/2021, SpO2 100 %.  Patient Vitals for the past 24 hrs:  BP Temp Temp src Pulse Resp SpO2 Height Weight  06/13/21 1030 131/82 98.5 F (36.9 C) Oral 79 19 100 % -- --  06/13/21 1023 -- -- -- -- -- -- 5\' 4"  (1.626 m) 118 kg  Physical Exam Vitals and nursing note reviewed.  Constitutional:      General: She is not in acute distress.    Appearance: She is well-developed.  HENT:     Head: Normocephalic.  Eyes:     Pupils: Pupils are equal, round, and reactive to light.  Cardiovascular:     Rate and Rhythm: Normal rate and regular rhythm.     Heart sounds: Normal heart sounds.  Pulmonary:     Effort: Pulmonary effort is normal. No respiratory distress.     Breath sounds: Normal breath sounds.  Abdominal:     General: Bowel sounds are normal. There is no distension.     Palpations: Abdomen is soft.     Tenderness: There is no abdominal tenderness.  Skin:    General: Skin is warm and dry.  Neurological:     Mental Status: She is alert and oriented to person, place, and time.  Psychiatric:        Mood and Affect: Mood normal.        Behavior: Behavior normal.        Thought Content: Thought content normal.         Judgment: Judgment normal.    MAU Course  Procedures  Results for orders placed or performed during the hospital encounter of 06/13/21 (from the past 24 hour(s))  Pregnancy, urine POC     Status: Abnormal   Collection Time: 06/13/21 10:05 AM  Result Value Ref Range   Preg Test, Ur POSITIVE (A) NEGATIVE  Urinalysis, Routine w reflex microscopic Urine, Clean Catch     Status: Abnormal   Collection Time: 06/13/21 10:06 AM  Result Value Ref Range   Color, Urine YELLOW YELLOW   APPearance HAZY (A) CLEAR   Specific Gravity, Urine 1.023 1.005 - 1.030   pH 5.0 5.0 - 8.0   Glucose, UA NEGATIVE NEGATIVE mg/dL   Hgb urine dipstick NEGATIVE NEGATIVE   Bilirubin Urine NEGATIVE NEGATIVE   Ketones, ur NEGATIVE NEGATIVE mg/dL   Protein, ur NEGATIVE NEGATIVE mg/dL   Nitrite NEGATIVE NEGATIVE   Leukocytes,Ua LARGE (A) NEGATIVE   RBC / HPF 0-5 0 - 5 RBC/hpf   WBC, UA 21-50 0 - 5 WBC/hpf   Bacteria, UA RARE (A) NONE SEEN   Squamous Epithelial / LPF 11-20 0 - 5   Mucus PRESENT   CBC     Status: Abnormal   Collection Time: 06/13/21 10:29 AM  Result Value Ref Range   WBC 5.5 4.0 - 10.5 K/uL   RBC 4.50 3.87 - 5.11 MIL/uL   Hemoglobin 11.1 (L) 12.0 - 15.0 g/dL   HCT 16.135.6 (L) 09.636.0 - 04.546.0 %   MCV 79.1 (L) 80.0 - 100.0 fL   MCH 24.7 (L) 26.0 - 34.0 pg   MCHC 31.2 30.0 - 36.0 g/dL   RDW 40.915.9 (H) 81.111.5 - 91.415.5 %   Platelets 212 150 - 400 K/uL   nRBC 0.0 0.0 - 0.2 %  hCG, quantitative, pregnancy     Status: Abnormal   Collection Time: 06/13/21 10:29 AM  Result Value Ref Range   hCG, Beta Chain, Quant, S 22,343 (H) <5 mIU/mL  Wet prep, genital     Status: Abnormal   Collection Time: 06/13/21 11:02 AM   Specimen: PATH Cytology Cervicovaginal Ancillary Only  Result Value Ref Range   Yeast Wet Prep HPF POC NONE SEEN NONE SEEN   Trich, Wet Prep NONE SEEN NONE SEEN   Clue Cells Wet Prep HPF POC PRESENT (  A) NONE SEEN   WBC, Wet Prep HPF POC >=10 (A) <10   Sperm NONE SEEN      US OB LESS THAN 14  WEEKS WITH OB TRANSVAGINAL  Result Date: 06/13/2021 CLINICAL DATA:  Vaginal bleeding and cramping in 1st trimester pregnancy. EXAM: OBSTETRIC <14 WK [place] AND TRANSVAGINAL OB US TECHNIQUE: Both transabdominal and transvaginal ultrasound examinations were performed for complete evaluation of the gestation as well as the maternal uterus, adnexal regions, and pelvic cul-de-sac. Transvaginal technique was performed to assess early pregnancy. COMPARISON:  None Available. FINDINGS: Intrauterine gestational sac: Single Yolk sac:  Visualized. Embryo:  Visualized. Cardiac Activity: Visualized. Heart Rate: 98 bpm CRL:  6 mm   6 w   2 d                  [place] EDC: 02/04/2022 Subchorionic hemorrhage:  None visualized. Maternal uterus/adnexae: Small right ovarian corpus luteum noted. Normal appearance of left ovary. No adnexal mass or abnormal free fluid identified. IMPRESSION: Single living IUP with estimated gestational age of [redacted] weeks 2 days, and [place] EDC of 02/04/2022. No maternal uterine or adnexal abnormality identified. Electronically Signed   By: Danae Orleans M.D.   On: 06/13/2021 11:56     MDM Labs ordered and reviewed.   UA, UPT, UC CBC, HCG ABO/Rh- A Pos Wet prep and gc/chlamydia US OB Comp Less 14 weeks with Transvaginal   CNM independently reviewed the imaging ordered. Imaging show normal IUP measuring 6 weeks with fetal bradycardia  Discussed with patient normal IUP but recommendation for repeat ultrasound in 2 weeks given fetal bradycardia. Appointment scheduled at [facility]  Assessment and Plan   1. Normal intrauterine pregnancy on prenatal ultrasound in first trimester   2. [redacted] weeks gestation of pregnancy   3. Bacterial vaginosis   4. Abdominal pain affecting pregnancy     -Discharge home in stable condition -Rx for metrogel sent to patient's pharmacy -First trimester precautions discussed -Patient advised to follow-up with [facility] in 2 weeks for repeat ultrasound  -Patient may return to MAU as needed  or if her condition were to change or worsen  Rolm Bookbinder, CNM 06/13/2021, 10:48 AM

## 2021-06-14 LAB — GC/CHLAMYDIA PROBE AMP (~~LOC~~) NOT AT ARMC
Chlamydia: NEGATIVE
Comment: NEGATIVE
Comment: NORMAL
Neisseria Gonorrhea: NEGATIVE

## 2021-06-14 LAB — CULTURE, OB URINE: Culture: <10000 — AB

## 2021-06-27 ENCOUNTER — Ambulatory Visit (INDEPENDENT_AMBULATORY_CARE_PROVIDER_SITE_OTHER): Payer: Medicaid Other

## 2021-06-27 ENCOUNTER — Other Ambulatory Visit: Payer: Self-pay

## 2021-06-27 VITALS — BP 137/83 | HR 67 | Temp 98.1°F

## 2021-06-27 DIAGNOSIS — O3680X0 Pregnancy with inconclusive fetal viability, not applicable or unspecified: Secondary | ICD-10-CM

## 2021-06-27 DIAGNOSIS — Z3A09 9 weeks gestation of pregnancy: Secondary | ICD-10-CM

## 2021-06-27 DIAGNOSIS — Z3491 Encounter for supervision of normal pregnancy, unspecified, first trimester: Secondary | ICD-10-CM

## 2021-06-27 MED ORDER — METOCLOPRAMIDE HCL 10 MG PO TABS: 10.0000 mg | ORAL_TABLET | Freq: Three times a day (TID) | ORAL | 1 refills | Status: DC | PRN

## 2021-06-27 NOTE — Progress Notes (Signed)
   GYNECOLOGY OFFICE VISIT NOTE  History:   Angela Hammond is a 26 y.o. G2P0000 here today for ultrasound results.  Ultrasound shows viable IUP measuring [redacted]w[redacted]d with FHR of 166 bpm.  Abdominal pain: No   Vaginal bleeding: No    OB History  Gravida Para Term Preterm AB Living  2 1 0 0 0 0  SAB IAB Ectopic Multiple Live Births  0 0 0 0      # Outcome Date GA Lbr Len/2nd Weight Sex Delivery Anes PTL Lv  2 Current           1 Para 07/08/20 [redacted]w[redacted]d / 00:06 6.9 oz (0.196 kg) M Vag-Breech None  FD     Health Maintenance Due  Topic Date Due   COVID-19 Vaccine (1) Never done   TETANUS/TDAP  Never done   PAP-Cervical Cytology Screening  11/04/2014   PAP SMEAR-Modifier  11/04/2014    Past Medical History:  Diagnosis Date   Gonorrhea 04/19/2011   History of gonorrhea    2013   Medical history non-contributory    Morbid obesity (HCC) 04/18/2011   Pelvic inflammatory disease (PID) 04/18/2011    Past Surgical History:  Procedure Laterality Date   TONSILLECTOMY     AGE 55    The following portions of the patient's history were reviewed and updated as appropriate: allergies, current medications, past family history, past medical history, past social history, past surgical history and problem list.   Review of Systems:  Pertinent items noted in HPI and remainder of comprehensive ROS otherwise negative.  Physical Exam:  BP 137/83   Pulse 67   Temp 98.1 F (36.7 C)   LMP 04/25/2021  CONSTITUTIONAL: Well-developed, well-nourished female in no acute distress.  HEENT:  Normocephalic, atraumatic. External right and left ear normal. No scleral icterus.  NECK: Normal range of motion, supple, no masses noted on observation SKIN: No rash noted. Not diaphoretic. No erythema. No pallor. MUSCULOSKELETAL: Normal range of motion. No edema noted. NEUROLOGIC: Alert and oriented to person, place, and time. Normal muscle tone coordination.  PSYCHIATRIC: Normal mood and affect. Normal behavior.  Normal judgment and thought content. RESPIRATORY: Effort normal, no problems with respiration noted  Assessment and Plan:  Viable Pregnancy - Z34.90  Reviewed results with patient that show a viable intrauterine pregnancy at 8 weeks of gestation. Recommended she contact providers to start prenatal care, and she was given a list of options in her AVS. Prescription sent for prenatal vitamins. Reviewed ED precautions including vaginal bleeding like a period or heavier, severe abdominal pain, and fever. All questions answered.   Please refer to After Visit Summary for other counseling recommendations.   Patient to follow up New OB intake 6/15 and new OB visit on 7/10  Total face-to-face time with patient: 20 minutes.  Over 50% of encounter was spent on counseling and coordination of care.   Rolm Bookbinder, CNM [facility] [employer]

## 2021-06-27 NOTE — Patient Instructions (Signed)

## 2021-06-29 ENCOUNTER — Telehealth (INDEPENDENT_AMBULATORY_CARE_PROVIDER_SITE_OTHER): Payer: Medicaid Other

## 2021-06-29 DIAGNOSIS — O09219 Supervision of pregnancy with history of pre-term labor, unspecified trimester: Secondary | ICD-10-CM

## 2021-06-29 DIAGNOSIS — Z3A00 Weeks of gestation of pregnancy not specified: Secondary | ICD-10-CM

## 2021-06-29 DIAGNOSIS — O0990 Supervision of high risk pregnancy, unspecified, unspecified trimester: Secondary | ICD-10-CM | POA: Insufficient documentation

## 2021-06-29 MED ORDER — GOJJI WEIGHT SCALE MISC: 1.0000 | 0 refills | Status: DC | PRN

## 2021-06-29 MED ORDER — BLOOD PRESSURE KIT DEVI: 1.0000 | 0 refills | Status: DC | PRN

## 2021-06-29 NOTE — Addendum Note (Signed)
Addended byVidal Schwalbe on: 06/29/2021 12:27 PM   Modules accepted: Orders

## 2021-06-29 NOTE — Progress Notes (Signed)
New OB Intake  I connected with  Angela Hammond on 06/29/21 at  9:15 AM EDT by MyChart Video Visit and verified that I am speaking with the correct person using two identifiers. Nurse is located at [facility] and pt is located at home.  I discussed the limitations, risks, security and privacy concerns of performing an evaluation and management service by telephone and the availability of in person appointments. I also discussed with the patient that there may be a patient responsible charge related to this service. The patient expressed understanding and agreed to proceed.  I explained I am completing New OB Intake today. We discussed her EDD of 01/30/2022 that is based on LMP of 04/25/2021. Pt is G2/P0. I reviewed her allergies, medications, Medical/Surgical/OB history, and appropriate screenings. I informed her of [facility] services. Based on history, this is an  pregnancy complicated by    Patient Active Problem List   Diagnosis Date Noted   Supervision of high risk pregnancy, antepartum 06/29/2021   Hx of one miscarriage 08/08/2020   Previable preterm premature rupture of membranes at [redacted] weeks gestation 07/08/2020   Morbid obesity (HCC) 04/18/2011    Concerns addressed today: -Hx of PPROM at 19 weeks. Patient stated she did not know she was pregnant. -Ordered Ultrasound for cervical length exam.   Delivery Plans:  Plans to deliver at [facility].   MyChart/Babyscripts MyChart access verified. I explained pt will have some visits in office and some virtually. Babyscripts instructions given and order placed. Patient verifies receipt of registration text/e-mail. Account successfully created and app downloaded.  Blood Pressure Cuff  Blood pressure cuff ordered for patient to pick-up from [employer]. Explained after first prenatal appt pt will check weekly and document in Babyscripts.  Weight scale: Patient have weight scale. Weight scale ordered for patient to pick up from [employer].    Anatomy US Explained first scheduled [place] will be around 19 weeks. Anatomy US scheduled for 08/29/2021 at 09:30. Pt notified to arrive at 09:15.   Labs Discussed Avelina Laine genetic screening with patient. Would like both Panorama and Horizon drawn at new OB visit.Also if interested in genetic testing, tell patient she will need AFP 15-21 weeks to complete genetic testing .Routine prenatal labs needed.  Covid Vaccine Patient has not covid vaccine.   Is patient a CenteringPregnancy candidate?  Declined Declined due to Schedule/Times   Is patient a Mom+Baby Combined Care candidate?  Declined    Is patient interested in [place]?  No    Informed patient of [facility] website  and placed link in her AVS.   Social Determinants of Health Food Insecurity: Patient denies food insecurity. WIC Referral: Patient is interested in referral to [facility].  Transportation: Patient denies transportation needs. Childcare: Discussed no children allowed at ultrasound appointments. Offered childcare services; patient declines childcare services at this time.  Send link to Pregnancy Navigators   Placed OB Box on problem list and updated  First visit review I reviewed new OB appt with pt. I explained she will have a pelvic exam, ob bloodwork with genetic screening, and PAP smear. Explained pt will be seen by Evalina Field MD at first visit; encounter routed to appropriate provider. Explained that patient will be seen by pregnancy navigator following visit with provider. [facility] information placed in AVS.   Vidal Schwalbe, CMA 06/29/2021  11:28 AM

## 2021-07-03 ENCOUNTER — Telehealth: Payer: Self-pay

## 2021-07-03 DIAGNOSIS — O0990 Supervision of high risk pregnancy, unspecified, unspecified trimester: Secondary | ICD-10-CM

## 2021-07-03 NOTE — Telephone Encounter (Signed)
Call transferred from front office stating patient had some questions on how to use her blood pressure cuff. I reviewed how to check blood pressure with patient. I also sent patient the link to babyscripts app and instructed patient to check her blood pressure 1-2x a week and chart it in the app. Patient verbalized understanding.   Alesia Richards, RN 07/03/21

## 2021-07-10 ENCOUNTER — Encounter (HOSPITAL_COMMUNITY): Payer: Self-pay | Admitting: *Deleted

## 2021-07-10 ENCOUNTER — Ambulatory Visit (HOSPITAL_COMMUNITY)
Admission: EM | Admit: 2021-07-10 | Discharge: 2021-07-10 | Disposition: A | Discharge status: Home / Self Care | Payer: Medicaid Other | Attending: Physician Assistant | Admitting: Physician Assistant

## 2021-07-10 DIAGNOSIS — O2391 Unspecified genitourinary tract infection in pregnancy, first trimester: Secondary | ICD-10-CM

## 2021-07-10 DIAGNOSIS — Z3A10 10 weeks gestation of pregnancy: Secondary | ICD-10-CM | POA: Diagnosis not present

## 2021-07-10 DIAGNOSIS — R8290 Unspecified abnormal findings in urine: Secondary | ICD-10-CM | POA: Diagnosis not present

## 2021-07-10 DIAGNOSIS — Z113 Encounter for screening for infections with a predominantly sexual mode of transmission: Secondary | ICD-10-CM | POA: Diagnosis present

## 2021-07-10 LAB — POCT URINALYSIS DIPSTICK, ED / UC
Bilirubin Urine: NEGATIVE
Glucose, UA: NEGATIVE mg/dL
Hgb urine dipstick: NEGATIVE
Ketones, ur: NEGATIVE mg/dL
Nitrite: NEGATIVE
Protein, ur: NEGATIVE mg/dL
Specific Gravity, Urine: 1.02 (ref 1.005–1.030)
Urobilinogen, UA: 0.2 mg/dL (ref 0.0–1.0)
pH: 6 (ref 5.0–8.0)

## 2021-07-10 LAB — HIV ANTIBODY (ROUTINE TESTING W REFLEX): HIV Screen 4th Generation wRfx: NONREACTIVE

## 2021-07-10 LAB — HEPATITIS C ANTIBODY: HCV Ab: NONREACTIVE

## 2021-07-10 NOTE — ED Triage Notes (Addendum)
C/O vaginal discharge x approx 1 wk without any abd pain or vag bleeding. Reports being [redacted] wks pregnant. Wishes to be checked for STDs.  States has upcoming OB appt in July.

## 2021-07-11 LAB — CERVICOVAGINAL ANCILLARY ONLY
Bacterial Vaginitis (gardnerella): POSITIVE — AB
Candida Glabrata: NEGATIVE
Candida Vaginitis: NEGATIVE
Chlamydia: NEGATIVE
Comment: NEGATIVE
Comment: NEGATIVE
Comment: NEGATIVE
Comment: NEGATIVE
Comment: NEGATIVE
Comment: NORMAL
Neisseria Gonorrhea: NEGATIVE
Trichomonas: NEGATIVE

## 2021-07-11 LAB — RPR: RPR Ser Ql: NONREACTIVE

## 2021-07-11 LAB — URINE CULTURE

## 2021-07-12 ENCOUNTER — Telehealth (HOSPITAL_COMMUNITY): Payer: Self-pay | Admitting: Emergency Medicine

## 2021-07-12 MED ORDER — METRONIDAZOLE 0.75 % VA GEL: 1.0000 | Freq: Every day | VAGINAL | 0 refills | Status: AC

## 2021-07-20 ENCOUNTER — Ambulatory Visit: Admission: RE | Admit: 2021-07-20 | Payer: Medicaid Other | Source: Ambulatory Visit

## 2021-07-22 ENCOUNTER — Encounter: Payer: Self-pay | Admitting: Family Medicine

## 2021-07-24 ENCOUNTER — Ambulatory Visit (INDEPENDENT_AMBULATORY_CARE_PROVIDER_SITE_OTHER): Payer: Medicaid Other | Admitting: Family Medicine

## 2021-07-24 ENCOUNTER — Other Ambulatory Visit: Payer: Self-pay

## 2021-07-24 ENCOUNTER — Encounter: Payer: Self-pay | Admitting: Family Medicine

## 2021-07-24 VITALS — BP 97/69 | HR 92 | Wt 254.5 lb

## 2021-07-24 DIAGNOSIS — Z5941 Food insecurity: Secondary | ICD-10-CM

## 2021-07-24 DIAGNOSIS — Z3A12 12 weeks gestation of pregnancy: Secondary | ICD-10-CM

## 2021-07-24 DIAGNOSIS — O0990 Supervision of high risk pregnancy, unspecified, unspecified trimester: Secondary | ICD-10-CM

## 2021-07-24 DIAGNOSIS — Z8759 Personal history of other complications of pregnancy, childbirth and the puerperium: Secondary | ICD-10-CM

## 2021-07-24 DIAGNOSIS — F439 Reaction to severe stress, unspecified: Secondary | ICD-10-CM

## 2021-07-24 NOTE — Progress Notes (Signed)
Patient here to initiate prenatal care. Pt looks visibly stressed about pregnancy and Behavior Health was explained and offered to patient but declined.

## 2021-07-24 NOTE — Progress Notes (Signed)
Subjective:   Angela Hammond is a 26 y.o. G2P0010 at 86w6dby LMP c/w 8 week [place] seen today for her first obstetrical visit.  Her obstetrical history is significant for history of PPROM and preterm delivery at 118weeks in June of 2022.   Patient reports no chronic medical conditions. Denies HTN or DM to her knowledge.   She is currently using a vaginal gel for BV. She denies taking any other medications other than a prenatal vitamin.   She denies alcohol/drug use.   Denies FHx of genetic syndromes.   Pregnancy history fully reviewed.  She reports she is feeling okay today.  She is having a lot of stress over her relationship with the FOB.  She reports feeling down overall about the situation.  She denies any thoughts of self harm or suicidal thoughts.  She prefers to cope with these things on her own.  She reports that the pregnancy was unplanned; however, she is planning to continue with the pregnancy at this time.  She has some family and friends that have been supportive.  She denies abdominal pain, vaginal bleeding, dysuria, vaginal discharge, or LE edema.  She has occasional nausea here and there.  She is eating and drinking well.   She has no other concerns today.   HISTORY: OB History  Gravida Para Term Preterm AB Living  2 1 0 0 0 0  SAB IAB Ectopic Multiple Live Births  0 0 0 0 0    # Outcome Date GA Lbr Len/2nd Weight Sex Delivery Anes PTL Lv  2 Current           1 Para 07/08/20 135w1d 00:06 6.9 oz (0.196 kg) M Vag-Breech None  FD     Name: CHKEVIANA, GUIDAD     Apgar1: 0  Apgar5: 0   Last pap smear per chart review in 2010, documented as normal. Due for repeat pap smear today.   Past Medical History:  Diagnosis Date   Gonorrhea 04/19/2011   History of gonorrhea    2013   [place]   Pelvic inflammatory disease (PID) 04/18/2011   Previable preterm premature rupture of membranes at [redacted] weeks gestation 07/08/2020   Past Surgical  History:  Procedure Laterality Date   TONSILLECTOMY     AGE [age] Family History  Problem Relation Age of Onset   Stroke Mother    Hypertension Mother    Hypertension Father    Diabetes Maternal Aunt    Stroke Maternal Aunt    Hypertension Maternal Aunt    Stroke Maternal Grandfather    Hypertension Maternal Grandfather    Social History   Tobacco Use   Smoking status: Never   Smokeless tobacco: Never  Vaping Use   Vaping Use: Never used  Substance Use Topics   Alcohol use: Not Currently   Drug use: No   No Known Allergies Current Outpatient Medications on File Prior to Visit  Medication Sig Dispense Refill   Prenatal Vit-Fe Fumarate-FA (PRENATAL PO) Take by mouth.     Blood Pressure Monitoring (BLOOD PRESSURE KIT) DEVI 1 Device by Does not apply route as needed. 1 each 0   Misc. Devices (GOJJI WEIGHT SCALE) MISC 1 Device by Does not apply route as needed. 1 each 0   [DISCONTINUED] dicyclomine (BENTYL) 20 MG tablet Take 1 tablet (20 mg total) by mouth 2 (two) times daily as needed for spasms. 20 tablet 0   [  DISCONTINUED] fluticasone (FLONASE) 50 MCG/ACT nasal spray Place 1 spray into both nostrils daily. 5 g 0   No current facility-administered medications on file prior to visit.   Exam   Vitals:   07/24/21 0925  BP: 97/69  Pulse: 92  Weight: 254 lb 8 oz (115.4 kg)   Fetal Heart Rate (bpm): 120 (U/S)  Chaperone present for exam.   System: General: Well-developed, well-nourished female in no acute distress   Skin: Warm and dry, no rashes noted    Neurologic: Alert and oriented x 4    Extremities: Normal ROM, no LE edema    HEENT MMM, EOMI, conjunctivae clear   Cardiovascular: Normal rate, warm and well perfused    Respiratory:  Normal work of breathing on room air    Abdomen: Soft, nontender, gravid      Assessment:   Pregnancy: G2P0010 Patient Active Problem List   Diagnosis Date Noted   Supervision of high risk pregnancy, antepartum 06/29/2021   Hx of  one miscarriage 08/08/2020   Morbid obesity ([place]) 04/18/2011   Plan:   1. Supervision of high risk pregnancy, antepartum 2. [redacted] weeks gestation of pregnancy Progressing well. FHT within normal limits via [place] with normal movement. Initial labs obtained as below. Due for pap smear but currently using vaginal metronidazole for BV, which may interfere with pap testing. Will plan to complete at next visit instead. Follow up for next OB visit in 4 weeks.  - Panorama Prenatal Test Full Panel - HORIZON CUSTOM - CBC/D/Plt+RPR+Rh+ABO+RubIgG... - Hemoglobin A1c  3. Hx of one miscarriage Hx of PPROM at 26 weeks in June of 2022. Reviewed case with attending Dr. Ilda Basset. No history of cervical insufficiency last pregnancy. Cervix normal appearing on last [place] at 8 weeks this pregnancy. Has follow up US on 7/17. Per Dr. Ilda Basset, no indication for cerclage at this time. May be a candidate for vaginal progesterone; however, will follow up next [place] prior to determining this. Discussed with patient who voiced understanding.   4. Stress Related to relationship with FOB. Mainly depressive symptoms at this time. No safety concerns. Offered BH referral today and patient declined. Recommended that she reach out to provider if this changes. Will continue to monitor closely.   5. Food insecurity Referred to food market today.  - AMBULATORY REFERRAL TO [place]  Initial labs drawn. Continue prenatal vitamins. Genetic screening discussed: NIPS/[facility]izon ordered. Ultrasound discussed; fetal anatomic survey: Scheduled. Problem list reviewed and updated. The nature of [place] with multiple MDs and other Advanced Practice Providers was explained to patient; also emphasized that residents, students are part of our team. Routine obstetric precautions reviewed.  Return in about 4 weeks (around 08/21/2021) for follow up HR OB visit.  Vilma Meckel, MD

## 2021-07-25 LAB — HEMOGLOBIN A1C
Est. average glucose Bld gHb Est-mCnc: 111 mg/dL
Hgb A1c MFr Bld: 5.5 % (ref 4.8–5.6)

## 2021-07-25 LAB — CBC/D/PLT+RPR+RH+ABO+RUBIGG...
Antibody Screen: NEGATIVE
Basophils Absolute: 0 10*3/uL (ref 0.0–0.2)
Basos: 0 %
EOS (ABSOLUTE): 0.1 10*3/uL (ref 0.0–0.4)
Eos: 1 %
HCV Ab: NONREACTIVE
HIV Screen 4th Generation wRfx: NONREACTIVE
Hematocrit: 36.7 % (ref 34.0–46.6)
Hemoglobin: 11.7 g/dL (ref 11.1–15.9)
Hepatitis B Surface Ag: NEGATIVE
Immature Grans (Abs): 0 10*3/uL (ref 0.0–0.1)
Immature Granulocytes: 0 %
Lymphocytes Absolute: 1.8 10*3/uL (ref 0.7–3.1)
Lymphs: 33 %
MCH: 25.1 pg — ABNORMAL LOW (ref 26.6–33.0)
MCHC: 31.9 g/dL (ref 31.5–35.7)
MCV: 79 fL (ref 79–97)
Monocytes Absolute: 0.7 10*3/uL (ref 0.1–0.9)
Monocytes: 13 %
Neutrophils Absolute: 2.9 10*3/uL (ref 1.4–7.0)
Neutrophils: 53 %
Platelets: 213 10*3/uL (ref 150–450)
RBC: 4.67 x10E6/uL (ref 3.77–5.28)
RDW: 15 % (ref 11.7–15.4)
RPR Ser Ql: NONREACTIVE
Rh Factor: POSITIVE
Rubella Antibodies, IGG: 3.98 index (ref >0.99)
WBC: 5.4 10*3/uL (ref 3.4–10.8)

## 2021-07-25 LAB — HCV INTERPRETATION

## 2021-07-31 ENCOUNTER — Other Ambulatory Visit: Payer: Self-pay | Admitting: Family Medicine

## 2021-07-31 ENCOUNTER — Ambulatory Visit
Admission: RE | Admit: 2021-07-31 | Discharge: 2021-07-31 | Disposition: A | Discharge status: Home / Self Care | Payer: Medicaid Other | Source: Ambulatory Visit | Attending: Family Medicine | Admitting: Family Medicine

## 2021-07-31 DIAGNOSIS — O0990 Supervision of high risk pregnancy, unspecified, unspecified trimester: Secondary | ICD-10-CM | POA: Insufficient documentation

## 2021-08-07 ENCOUNTER — Other Ambulatory Visit: Payer: Self-pay | Admitting: Family Medicine

## 2021-08-07 DIAGNOSIS — Z8759 Personal history of other complications of pregnancy, childbirth and the puerperium: Secondary | ICD-10-CM

## 2021-08-08 ENCOUNTER — Telehealth: Payer: Self-pay | Admitting: Lactation Services

## 2021-08-08 ENCOUNTER — Encounter: Payer: Self-pay | Admitting: Family Medicine

## 2021-08-08 DIAGNOSIS — Z148 Genetic carrier of other disease: Secondary | ICD-10-CM | POA: Insufficient documentation

## 2021-08-08 DIAGNOSIS — D563 Thalassemia minor: Secondary | ICD-10-CM | POA: Insufficient documentation

## 2021-08-08 NOTE — Telephone Encounter (Signed)
Called patient to inform her of [facility] Genetic Screening showing that she is a silent carrier for Alpha Thalassemia and increased carrier risk for SMA.   Reviewed it is recommended that she call [facility] at [phone] to set up a Genetic Counseling Session to discuss results.   Reviewed it is recommended that the father of the baby be tested to see if he carries the same genes. Reviewed we do have the kits in the office that can be picked up at her appointment. Patient reports this most likely will not be an option.   Questions answered. Patient informed to call with any questions or concerns.

## 2021-08-21 ENCOUNTER — Telehealth (INDEPENDENT_AMBULATORY_CARE_PROVIDER_SITE_OTHER): Payer: Medicaid Other | Admitting: Obstetrics and Gynecology

## 2021-08-21 ENCOUNTER — Telehealth: Payer: Self-pay

## 2021-08-21 VITALS — BP 113/77 | HR 72

## 2021-08-21 DIAGNOSIS — O0992 Supervision of high risk pregnancy, unspecified, second trimester: Secondary | ICD-10-CM

## 2021-08-21 DIAGNOSIS — O0990 Supervision of high risk pregnancy, unspecified, unspecified trimester: Secondary | ICD-10-CM

## 2021-08-21 DIAGNOSIS — O99212 Obesity complicating pregnancy, second trimester: Secondary | ICD-10-CM

## 2021-08-21 DIAGNOSIS — O09292 Supervision of pregnancy with other poor reproductive or obstetric history, second trimester: Secondary | ICD-10-CM

## 2021-08-21 DIAGNOSIS — Z6841 Body Mass Index (BMI) 40.0 and over, adult: Secondary | ICD-10-CM | POA: Diagnosis not present

## 2021-08-21 DIAGNOSIS — Z3A16 16 weeks gestation of pregnancy: Secondary | ICD-10-CM

## 2021-08-21 MED ORDER — PROGESTERONE 200 MG PO CAPS: 200.0000 mg | ORAL_CAPSULE | Freq: Every day | ORAL | 2 refills | Status: DC

## 2021-08-21 MED ORDER — ASPIRIN 81 MG PO TBEC: 81.0000 mg | DELAYED_RELEASE_TABLET | Freq: Every day | ORAL | 2 refills | Status: DC

## 2021-08-21 NOTE — Telephone Encounter (Signed)
!  st attempt to call patient in regards to virtual appointment scheduled for today @8 :15am.  Patient did not answer and Hipaa compliant VM was left along with office number.   , CMA  08/21/21

## 2021-08-21 NOTE — Progress Notes (Signed)
I connected with  Angela Hammond on 08/21/21 at  8:15 AM EDT by MyChart Virtual Video Visit and verified that I am speaking with the correct person using two identifiers.   I discussed the limitations, risks, security and privacy concerns of performing an evaluation and management service by telephone and the availability of in person appointments. I also discussed with the patient that there may be a patient responsible charge related to this service. The patient expressed understanding and agreed to proceed.  Guy Begin, CMA 08/21/2021  8:24 AM

## 2021-08-21 NOTE — Progress Notes (Signed)
TELEHEALTH OBSTETRICS VISIT ENCOUNTER NOTE  Provider location: Center for [facility]   Patient location: Home  I connected with Angela Hammond on 08/21/21 at  8:15 AM EDT by telephone at home and verified that I am speaking with the correct person using two identifiers. Of note, unable to do video encounter due to technical difficulties.    I discussed the limitations, risks, security and privacy concerns of performing an evaluation and management service by telephone and the availability of in person appointments. I also discussed with the patient that there may be a patient responsible charge related to this service. The patient expressed understanding and agreed to proceed.  Subjective:  Angela Hammond is a 26 y.o. G2P0000 at [redacted]w[redacted]d being followed for ongoing prenatal care.  She is currently monitored for the following issues for this high-risk pregnancy and has Morbid obesity (HCC); Hx of one miscarriage; Supervision of high risk pregnancy, antepartum; Alpha thalassemia silent carrier; and Genetic carrier - at risk of SMA carrier on their problem list.  Patient reports  rare, intermittent cramping but no VB or spotting . Reports fetal movement. Denies any contractions, bleeding or leaking of fluid.   The following portions of the patient's history were reviewed and updated as appropriate: allergies, current medications, past family history, past medical history, past social history, past surgical history and problem list.   Objective:  Blood pressure 113/77, pulse 72, last menstrual period 04/25/2021. General:  Alert, oriented and cooperative.   Mental Status: Normal mood and affect perceived. Normal judgment and thought content.  Rest of physical exam deferred due to type of encounter  Assessment and Plan:  Pregnancy: G2P0000 at [redacted]w[redacted]d 1. Supervision of high risk pregnancy, antepartum Normal labs last visit. Offer afp next time she's in person Pt  amenable to starting low dose asa - [place] MFM OB Transvaginal; Future  2. BMI 40.0-44.9, adult (HCC)  3. Morbid obesity (HCC)  4. History of preterm premature rupture of membranes (PROM) in previous pregnancy, currently pregnant in second trimester Unfortunately, patient is a virtual visit today. I told her that her s/s are probably normal but given her h/o June 2022 19wk PPROM and delivery (she presented with VB and pain/non classic signs for cervical insufficiency) that I recommend she get a transvag u/s for a cervical length before her 8/15 anatomy and cx length; she did have a TAUS on 7/17 and although a CL wasn't explicit measured, I just looked at the images and it appears to be b/w 3-4cm. I told her that I recommend getting a CL today or tomorrow with MFM but she says that she can't b/c of work schedule. Rationale and reasoning and importance d/w her re: need for a CL but she says even just going next week will be difficult. I told her that b/c of this I recommend she go to MAU as soon as she gets a chance then for a transvag cx length. Right now, I told her I recommend doing vaginal prometrium qhs which was sent in for her.   Preterm labor symptoms and general obstetric precautions including but not limited to vaginal bleeding, contractions, leaking of fluid and fetal movement were reviewed in detail with the patient.  I discussed the assessment and treatment plan with the patient. The patient was provided an opportunity to ask questions and all were answered. The patient agreed with the plan and demonstrated an understanding of the instructions. The patient was advised to call  back or seek an in-person office evaluation/go to [facility]U at [facility] for any urgent or concerning symptoms. Please refer to After Visit Summary for other counseling recommendations.   I provided 15 minutes of non-face-to-face time during this encounter.  No follow-ups on file.  Future Appointments   Date Time Provider Department Center  08/29/2021  9:15 AM [facility] NURSE [facility]  08/29/2021  9:30 AM [facility] US2 [facility]    [place] Bing, MD Center for [employer], [facility]

## 2021-08-29 ENCOUNTER — Ambulatory Visit (HOSPITAL_BASED_OUTPATIENT_CLINIC_OR_DEPARTMENT_OTHER): Payer: Medicaid Other | Admitting: Maternal & Fetal Medicine

## 2021-08-29 ENCOUNTER — Encounter: Payer: Self-pay | Admitting: *Deleted

## 2021-08-29 ENCOUNTER — Ambulatory Visit: Payer: Medicaid Other | Admitting: *Deleted

## 2021-08-29 ENCOUNTER — Ambulatory Visit: Payer: Medicaid Other | Attending: Family Medicine

## 2021-08-29 VITALS — BP 105/52 | HR 90

## 2021-08-29 DIAGNOSIS — Z148 Genetic carrier of other disease: Secondary | ICD-10-CM

## 2021-08-29 DIAGNOSIS — O0990 Supervision of high risk pregnancy, unspecified, unspecified trimester: Secondary | ICD-10-CM

## 2021-08-29 DIAGNOSIS — O09212 Supervision of pregnancy with history of pre-term labor, second trimester: Secondary | ICD-10-CM | POA: Diagnosis not present

## 2021-08-29 DIAGNOSIS — D563 Thalassemia minor: Secondary | ICD-10-CM

## 2021-08-29 DIAGNOSIS — Z8759 Personal history of other complications of pregnancy, childbirth and the puerperium: Secondary | ICD-10-CM | POA: Diagnosis present

## 2021-08-29 DIAGNOSIS — Z3A18 18 weeks gestation of pregnancy: Secondary | ICD-10-CM

## 2021-08-29 DIAGNOSIS — O3432 Maternal care for cervical incompetence, second trimester: Secondary | ICD-10-CM

## 2021-08-29 DIAGNOSIS — O99212 Obesity complicating pregnancy, second trimester: Secondary | ICD-10-CM

## 2021-08-29 DIAGNOSIS — E669 Obesity, unspecified: Secondary | ICD-10-CM

## 2021-08-29 DIAGNOSIS — O285 Abnormal chromosomal and genetic finding on antenatal screening of mother: Secondary | ICD-10-CM

## 2021-08-30 ENCOUNTER — Encounter (HOSPITAL_COMMUNITY): Payer: Self-pay | Admitting: Obstetrics and Gynecology

## 2021-08-30 ENCOUNTER — Inpatient Hospital Stay (EMERGENCY_DEPARTMENT_HOSPITAL)
Admission: AD | Admit: 2021-08-30 | Discharge: 2021-08-30 | Disposition: A | Discharge status: Home / Self Care | Payer: Medicaid Other | Source: Home / Self Care | Attending: Obstetrics and Gynecology | Admitting: Obstetrics and Gynecology

## 2021-08-30 DIAGNOSIS — Z8759 Personal history of other complications of pregnancy, childbirth and the puerperium: Secondary | ICD-10-CM | POA: Insufficient documentation

## 2021-08-30 DIAGNOSIS — Z7982 Long term (current) use of aspirin: Secondary | ICD-10-CM | POA: Insufficient documentation

## 2021-08-30 DIAGNOSIS — O3432 Maternal care for cervical incompetence, second trimester: Secondary | ICD-10-CM | POA: Insufficient documentation

## 2021-08-30 DIAGNOSIS — O09292 Supervision of pregnancy with other poor reproductive or obstetric history, second trimester: Secondary | ICD-10-CM | POA: Insufficient documentation

## 2021-08-30 DIAGNOSIS — O26899 Other specified pregnancy related conditions, unspecified trimester: Secondary | ICD-10-CM

## 2021-08-30 DIAGNOSIS — O09212 Supervision of pregnancy with history of pre-term labor, second trimester: Secondary | ICD-10-CM | POA: Insufficient documentation

## 2021-08-30 DIAGNOSIS — O99212 Obesity complicating pregnancy, second trimester: Secondary | ICD-10-CM | POA: Insufficient documentation

## 2021-08-30 DIAGNOSIS — O26892 Other specified pregnancy related conditions, second trimester: Secondary | ICD-10-CM

## 2021-08-30 DIAGNOSIS — N883 Incompetence of cervix uteri: Secondary | ICD-10-CM

## 2021-08-30 DIAGNOSIS — Z3A18 18 weeks gestation of pregnancy: Secondary | ICD-10-CM | POA: Insufficient documentation

## 2021-08-30 DIAGNOSIS — R102 Pelvic and perineal pain: Secondary | ICD-10-CM

## 2021-08-30 NOTE — MAU Note (Signed)
Angela Hammond is a 26 y.o. at [redacted]w[redacted]d here in MAU reporting: arrival by EMS for pelvic and rectal pressure that began 08/29/2021. Has a hx of preterm delivery. NO VB. Reports leaking of yellow fluid.   Onset of complaint: 08/29/2021 Pain score: 7/10 Vitals:   08/30/21 0017  BP: 117/62  Pulse: 87  Resp: 16  Temp: 98.2 F (36.8 C)  SpO2: 100%      Lab orders placed from triage:

## 2021-08-30 NOTE — MAU Provider Note (Signed)
Chief Complaint:  Pelvic Pain   Event Date/Time   First Provider Initiated Contact with Patient 08/30/21 0018      HPI: Angela Hammond is a 26 y.o. G2P0000 at 68w1dho presents via EMS to maternity admissions reporting pelvic and rectal pressure with lower abdominal cramping off and on. . She denies LOF, vaginal bleeding, urinary symptoms, n/v, diarrhea, constipation or fever/chills.    She was seen in MFM yesterday and they reported that her cervix was completely effaced and appeared to be dilated, though pt declined speculum exam and stated she did not want any intervention.   Her history is remarkable for a fetal loss at 19 weeks last year due to PPROM.   Pelvic Pain The patient's primary symptoms include pelvic pain. The patient's pertinent negatives include no genital itching, genital odor, vaginal bleeding or vaginal discharge. This is a new problem. The current episode started today. The pain is mild. The problem affects both sides. She is pregnant. Pertinent negatives include no abdominal pain (reports as pressure), chills, constipation, diarrhea, dysuria, fever or nausea. Nothing aggravates the symptoms. She has tried nothing for the symptoms.  RN Note: Angela GADBOISis a 26y.o. at 147w1dere in MAU reporting: arrival by EMS for pelvic and rectal pressure that began 08/29/2021. Has a hx of preterm delivery. NO VB. Reports leaking of yellow fluid.   Onset of complaint: 08/29/2021 Pain score: 7/10   Past Medical History: Past Medical History:  Diagnosis Date   Gonorrhea 04/19/2011   History of gonorrhea    2013   Morbid obesity ([place]04/03/2011   Pelvic inflammatory disease (PID) 04/18/2011   Previable preterm premature rupture of membranes at [redacted] weeks gestation 07/08/2020    Past obstetric history: OB History  Gravida Para Term Preterm AB Living  2 1 0 0 0 0  SAB IAB Ectopic Multiple Live Births  0 0 0 0      # Outcome Date GA Lbr Len/2nd Weight Sex Delivery Anes PTL  Lv  2 Current           1 Para 07/08/20 1926w1d00:06 196 g M Vag-Breech None  FD    Past Surgical History: Past Surgical History:  Procedure Laterality Date   TONSILLECTOMY     AGE 61    Family History: Family History  Problem Relation Age of Onset   Stroke Mother    Hypertension Mother    Hypertension Father    Diabetes Maternal Aunt    Stroke Maternal Aunt    Hypertension Maternal Aunt    Stroke Maternal Grandfather    Hypertension Maternal Grandfather     Social History: Social History   Tobacco Use   Smoking status: Never   Smokeless tobacco: Never  Vaping Use   Vaping Use: Never used  Substance Use Topics   Alcohol use: Not Currently   Drug use: No    Allergies: No Known Allergies  Meds:  Medications Prior to Admission  Medication Sig Dispense Refill Last Dose   aspirin EC 81 MG tablet Take 1 tablet (81 mg total) by mouth daily. (Patient not taking: Reported on 08/29/2021) 60 tablet 2    Blood Pressure Monitoring (BLOOD PRESSURE KIT) DEVI 1 Device by Does not apply route as needed. 1 each 0    Misc. Devices (GOJJI WEIGHT SCALE) MISC 1 Device by Does not apply route as needed. 1 each 0    Prenatal Vit-Fe Fumarate-FA (PRENATAL PO) Take by mouth. (Patient not taking:  Reported on 08/29/2021)      progesterone (PROMETRIUM) 200 MG capsule Place 1 capsule (200 mg total) vaginally at bedtime. (Patient not taking: Reported on 08/29/2021) 60 capsule 2     I have reviewed patient's Past Medical Hx, Surgical Hx, Family Hx, Social Hx, medications and allergies.   ROS:  Review of Systems  Constitutional:  Negative for chills and fever.  Gastrointestinal:  Negative for abdominal pain (reports as pressure), constipation, diarrhea and nausea.  Genitourinary:  Positive for pelvic pain. Negative for dysuria and vaginal discharge.   Other systems negative  Physical Exam  No data found. Constitutional: Well-developed, well-nourished female in no acute distress.   Cardiovascular: normal rate  Respiratory: normal effort GI: Abd soft, non-tender, gravid appropriate for gestational age.   No rebound or guarding. MS: Extremities nontender, no edema, normal ROM Neurologic: Alert and oriented x 4.  GU: Neg CVAT.  PELVIC EXAM: Cervix not able to be seen easily with speculum  Digital exam done and cervix found to be 1-2cm/60% effaced (about 1.5-2cm long)/high   Fetal parts are not low in vagina. Unable to palpate presenting part.      Labs: No results found for this or any previous visit (from the past 24 hour(s)). A/Positive/-- (07/10 1048)  Imaging (done in MFM Yesterday):  [place] MFM OB DETAIL +14 WK  Result Date: 08/29/2021 ----------------------------------------------------------------------  OBSTETRICS REPORT                       (Signed Final 08/29/2021 05:33 pm) ---------------------------------------------------------------------- Patient Info  ID #:       [id number]                          D.O.B.:  12-Sep-1995 (25 yrs)  Name:       Angela Hammond                 Visit Date: 08/29/2021 08:41 am ---------------------------------------------------------------------- Performed By  Attending:        Sander Nephew      Referred By:      Margreta Journey                    MD                                       Gwenlyn Perking MD  Performed By:     Stephenie Acres        [facility]                                  [facility]                                                             [facility] ---------------------------------------------------------------------- Orders  #  Description                           Code        Ordered By  1  [place] MFM OB DETAIL +14 [place]               [id number]    Vilma Meckel  2  [place] MFM OB TRANSVAGINAL                [id number]     CHRISTINA ALBERT ----------------------------------------------------------------------  #  Order  #                     Accession #                Episode #  1  [id number]                   [id number]                 [id number]  2  [id number]                   [id number]                 [id number] ---------------------------------------------------------------------- Indications  [redacted] weeks gestation of pregnancy                Z3A.18  Encounter for antenatal screening for          Z36.3  malformations  Poor obstetric history: Previous preterm       O09.219  delivery, antepartum  Obesity complicating pregnancy, second         O99.212  trimester (BMI 43)  Poor obstetric history: Previous preterm       O09.219  delivery, antepartum (19 wk PPROM)  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 Cord Insertion:      Visualized, central  Amniotic Fluid  AFI FV:      Within normal limits                              Largest Pocket(cm)                              3.9 ---------------------------------------------------------------------- Biometry  BPD:     39.47  mm     G. Age:  18w 0d         50  %    CI:        76.58   %    70 - 86  FL/HC:      18.5   %    15.8 - 18  HC:    142.89   mm     G. Age:  17w 4d         20  %    HC/AC:      1.16        1.07 - 1.29  AC:    122.98   mm     G. Age:  18w 0d         19  %    FL/BPD:     66.9   %  FL:      26.39  mm     G. Age:  18w 0d         45  %    FL/AC:      21.5   %    20 - 24  HUM:      25.5  mm     G. Age:  18w 0d         56  %  CER:      17.4  mm     G. Age:  17w 3d         30  %  NFT:       3.0  mm  LV:          8  mm  CM:        3.7  mm  Est. FW:     217  gm      0 lb 8 oz     42  %  ---------------------------------------------------------------------- OB History  Gravidity:    2         Term:   1  Living:       0 ---------------------------------------------------------------------- Gestational Age  LMP:           18w 0d        Date:  04/25/21                  EDD:   01/30/22  U/S Today:     17w 6d                                        EDD:   01/31/22  Best:          18w 0d     Det. By:  LMP  (04/25/21)          EDD:   01/30/22 ---------------------------------------------------------------------- Anatomy  Cranium:               Appears normal         LVOT:                   Not well visualized  Cavum:                 Appears normal         Aortic Arch:            Not well visualized  Ventricles:            Not well visualized    Ductal Arch:            Not well visualized  Choroid Plexus:        Appears normal         Diaphragm:  Appears normal  Cerebellum:            Appears normal         Stomach:                Appears normal, left                                                                        sided  Posterior Fossa:       Appears normal         Abdomen:                Appears normal  Nuchal Fold:           Appears normal         Abdominal Wall:         Appears nml (cord                                                                        insert, abd wall)  Face:                  Orbits nl; profile not Cord Vessels:           Appears normal (3                         well visualized                                vessel cord)  Lips:                  Not well visualized    Kidneys:                Appear normal  Palate:                Not well visualized    Bladder:                Appears normal  Thoracic:              Appears normal         Spine:                  Appears normal  Heart:                 Not well visualized    Upper Extremities:      Appears normal  RVOT:                  Not well visualized    Lower Extremities:      Appears normal  Other:  Technically  difficult due to maternal habitus and fetal position. Fetus          appears to be female. Heels/feet and open hands/5th digits visualized. ---------------------------------------------------------------------- Cervix Uterus Adnexa  Cervix  Length:  0  cm.  Appears dilated, see comments.  Uterus  No abnormality visualized.  Right Ovary  Within normal limits.  Left Ovary  Within normal limits.  Cul De Sac  No free fluid seen.  Adnexa  No abnormality visualized. ---------------------------------------------------------------------- Impression  Single intrauterine pregnancy here for a detailed anatomy  due to elevated BMI and history of cervical insufficiency  Normal anatomy with measurements consistent with dates  There is good fetal movement and amniotic fluid volume  Suboptimal views of the fetal anatomy were obtained  secondary to fetal position.  She was previously had a CL that appeared normal  according to Dr. Ilda Basset, but was not measured. She was  started on vaginal progesterone, which she is not taking.  The cervix appears dialated today. Therefore we performed a  TV ultrasound.  A speculum exam was offered, but she declined.  As she  says she would not intervention and would prefer to deliver  prematurely as she is not sure she is prepared to care for a  child at this time.  I discussed the risk of bleeding and  a viable delivery with  extreme prematurity. She was not in an emotional state to  discuss.  She also declined a one week follow up.  I called Dr. Ilda Basset and spoke with [facility] about Ms.  Belardo he will have her return in 1 week. ----------------------------------------------------------------------              Sander Nephew, MD Electronically Signed Final Report   08/29/2021 05:33 pm ----------------------------------------------------------------------  [place] MFM OB Transvaginal  Result Date: 08/29/2021 ----------------------------------------------------------------------   OBSTETRICS REPORT                       (Signed Final 08/29/2021 05:33 pm) ---------------------------------------------------------------------- Patient Info  ID #:       [id number]                          D.O.B.:  1995/08/11 (25 yrs)  Name:       Angela Hammond                 Visit Date: 08/29/2021 08:41 am ---------------------------------------------------------------------- Performed By  Attending:        Sander Nephew      Referred By:      Margreta Journey                    MD                                       ALBERT MD  Performed By:     Stephenie Acres        Location[facility]                                  [facility] ---------------------------------------------------------------------- Orders  #  Description                           Code        Ordered By  1  [place] MFM OB DETAIL +14 [place]               [id number]    Vilma Meckel  2  [place] MFM OB TRANSVAGINAL                78676.7     [facility] ALBERT ----------------------------------------------------------------------  #  Order #                     Accession #                Episode #  1  [id number]                   [id number]                 [id number]  2  [id number]                   [id number]                 [id number] ---------------------------------------------------------------------- Indications  [redacted] weeks gestation of pregnancy                Z3A.18  Encounter for antenatal screening for          Z36.3  malformations  Poor obstetric history: Previous preterm       O09.219  delivery, antepartum  Obesity complicating pregnancy, second         O99.212  trimester (BMI 43)  Poor obstetric history: Previous preterm       O09.219  delivery, antepartum (19 wk PPROM)  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 Cord Insertion:      Visualized, central  Amniotic Fluid  AFI FV:      Within normal limits                              Largest Pocket(cm)                              3.9 ---------------------------------------------------------------------- Biometry  BPD:     39.47  mm     G. Age:  18w 0d  50  %    CI:        76.58   %    70 - 86                                                          FL/HC:      18.5   %    15.8 - 18  HC:    142.89   mm     G. Age:  17w 4d         20  %    HC/AC:      1.16        1.07 - 1.29  AC:    122.98   mm     G. Age:  18w 0d         46  %    FL/BPD:     66.9   %  FL:      26.39  mm     G. Age:  18w 0d         45  %    FL/AC:      21.5   %    20 - 24  HUM:      25.5  mm     G. Age:  18w 0d         56  %  CER:      17.4  mm     G. Age:  17w 3d         30  %  NFT:       3.0  mm  LV:          8  mm  CM:        3.7  mm  Est. FW:     217  gm      0 lb 8 oz     42  % ---------------------------------------------------------------------- OB History  Gravidity:    2         Term:   1  Living:       0 ---------------------------------------------------------------------- Gestational Age  LMP:           18w 0d        Date:  04/25/21                  EDD:   01/30/22  U/S Today:     17w 6d                                        EDD:   01/31/22  Best:          18w 0d     Det. By:  LMP  (04/25/21)          EDD:   01/30/22 ---------------------------------------------------------------------- Anatomy  Cranium:               Appears normal         LVOT:                   Not well visualized  Cavum:                 Appears normal  Aortic Arch:            Not well visualized  Ventricles:            Not well visualized    Ductal Arch:             Not well visualized  Choroid Plexus:        Appears normal         Diaphragm:              Appears normal  Cerebellum:            Appears normal         Stomach:                Appears normal, left                                                                        sided  Posterior Fossa:       Appears normal         Abdomen:                Appears normal  Nuchal Fold:           Appears normal         Abdominal Wall:         Appears nml (cord                                                                        insert, abd wall)  Face:                  Orbits nl; profile not Cord Vessels:           Appears normal (3                         well visualized                                vessel cord)  Lips:                  Not well visualized    Kidneys:                Appear normal  Palate:                Not well visualized    Bladder:                Appears normal  Thoracic:              Appears normal         Spine:                  Appears normal  Heart:                 Not well visualized    Upper  Extremities:      Appears normal  RVOT:                  Not well visualized    Lower Extremities:      Appears normal  Other:  Technically difficult due to maternal habitus and fetal position. Fetus          appears to be female. Heels/feet and open hands/5th digits visualized. ---------------------------------------------------------------------- Cervix Uterus Adnexa  Cervix  Length:              0  cm.  Appears dilated, see comments.  Uterus  No abnormality visualized.  Right Ovary  Within normal limits.  Left Ovary  Within normal limits.  Cul De Sac  No free fluid seen.  Adnexa  No abnormality visualized. ---------------------------------------------------------------------- Impression  Single intrauterine pregnancy here for a detailed anatomy  due to elevated BMI and history of cervical insufficiency  Normal anatomy with measurements consistent with dates  There is good fetal movement and amniotic fluid  volume  Suboptimal views of the fetal anatomy were obtained  secondary to fetal position.  She was previously had a CL that appeared normal  according to Dr. Ilda Basset, but was not measured. She was  started on vaginal progesterone, which she is not taking.  The cervix appears dialated today. Therefore we performed a  TV ultrasound.  A speculum exam was offered, but she declined.  As she  says she would not intervention and would prefer to deliver  prematurely as she is not sure she is prepared to care for a  child at this time.  I discussed the risk of bleeding and  a viable delivery with  extreme prematurity. She was not in an emotional state to  discuss.  She also declined a one week follow up.  I called Dr. Ilda Basset and spoke with [facility] about Ms.  Kothari he will have her return in 1 week. ----------------------------------------------------------------------              Sander Nephew, MD Electronically Signed Final Report   08/29/2021 05:33 pm ----------------------------------------------------------------------   MAU Course/MDM: I have reviewed the triage vital signs and the nursing notes.   Pertinent labs & imaging results that were available during my care of the patient were reviewed by me and considered in my medical decision making (see chart for details).      I have reviewed her medical records including past results, notes and treatments.   Consult Dr Damita Dunnings with presentation, exam findings. She recommends expectant management if patient declines rescue cerclage, which she does decline right now.    Assessment: Single IUP at 28w1dIncompetent Cervix No imminent delivery  Plan: Discharge home Offered referral for possible rescue cerclage, she declines for now Preterm Labor precautions and fetal kick counts Follow up in Office for prenatal visits and recheck Encouraged to return if she develops worsening of symptoms, increase in pain, fever, or other concerning symptoms  Pt stable  at time of discharge.  MHansel FeinsteinCNM, MSN Certified Nurse-Midwife 08/30/2021 12:18 AM

## 2021-08-30 NOTE — Progress Notes (Addendum)
MFM Brief Note  Ms. Lubbers is 26 yo G2P0 at [facility] for evaluation regarding history of PTD and possible cervical insufficiency. She is seen the request of Dr. Gargatha Bing.   Her prenatal care has been scant.  Single intrauterine pregnancy here for a detailed anatomy due to elevated BMI and history of cervical insufficiency Normal anatomy with measurements consistent with dates There is good fetal movement and amniotic fluid volume Suboptimal views of the fetal anatomy were obtained secondary to fetal position.  She was previously had a CL that appeared normal according to Dr. Vergie Living, but was not measured. She was started on vaginal progesterone, which she is not taking.  The cervix appears dialated today. Therefore we performed a TV ultrasound.  A speculum exam was offered, but she declined.  As she says she would not intervention and would prefer to deliver prematurely as she is not sure she is prepared to care for a child at this time.  I discussed the risk of bleeding and  a viable delivery with extreme prematurity. She was not in an emotional state to discuss.  She also declined a one week follow up.   I called Dr. Vergie Living and spoke with him about Ms. Patman he will have her return in 1 week.  I spent 30 minutes with > 50% in face to face consultation and care coordination.  All questions answered.  Novella Olive, MD

## 2021-08-31 ENCOUNTER — Encounter (HOSPITAL_COMMUNITY): Payer: Self-pay | Admitting: Obstetrics & Gynecology

## 2021-08-31 ENCOUNTER — Inpatient Hospital Stay (HOSPITAL_COMMUNITY)
Admission: AD | Admit: 2021-08-31 | Discharge: 2021-09-01 | DRG: 779 | Disposition: A | Discharge status: Home / Self Care | Payer: Medicaid Other | Attending: Obstetrics & Gynecology | Admitting: Obstetrics & Gynecology

## 2021-08-31 ENCOUNTER — Other Ambulatory Visit: Payer: Self-pay

## 2021-08-31 DIAGNOSIS — Z3A18 18 weeks gestation of pregnancy: Secondary | ICD-10-CM

## 2021-08-31 DIAGNOSIS — D563 Thalassemia minor: Secondary | ICD-10-CM | POA: Diagnosis present

## 2021-08-31 DIAGNOSIS — O42912 Preterm premature rupture of membranes, unspecified as to length of time between rupture and onset of labor, second trimester: Secondary | ICD-10-CM | POA: Diagnosis present

## 2021-08-31 DIAGNOSIS — O26892 Other specified pregnancy related conditions, second trimester: Secondary | ICD-10-CM | POA: Diagnosis not present

## 2021-08-31 DIAGNOSIS — O99212 Obesity complicating pregnancy, second trimester: Secondary | ICD-10-CM | POA: Diagnosis present

## 2021-08-31 DIAGNOSIS — O0990 Supervision of high risk pregnancy, unspecified, unspecified trimester: Principal | ICD-10-CM

## 2021-08-31 DIAGNOSIS — O3432 Maternal care for cervical incompetence, second trimester: Secondary | ICD-10-CM | POA: Diagnosis present

## 2021-08-31 DIAGNOSIS — N883 Incompetence of cervix uteri: Secondary | ICD-10-CM | POA: Diagnosis not present

## 2021-08-31 DIAGNOSIS — Z8759 Personal history of other complications of pregnancy, childbirth and the puerperium: Secondary | ICD-10-CM | POA: Diagnosis not present

## 2021-08-31 DIAGNOSIS — O021 Missed abortion: Principal | ICD-10-CM | POA: Diagnosis present

## 2021-08-31 LAB — AMNISURE RUPTURE OF MEMBRANE (ROM) NOT AT ARMC: Amnisure ROM: NEGATIVE

## 2021-08-31 LAB — URINALYSIS, ROUTINE W REFLEX MICROSCOPIC
Bilirubin Urine: NEGATIVE
Glucose, UA: NEGATIVE mg/dL
Hgb urine dipstick: NEGATIVE
Ketones, ur: 5 mg/dL — AB
Leukocytes,Ua: NEGATIVE
Nitrite: NEGATIVE
Protein, ur: NEGATIVE mg/dL
Specific Gravity, Urine: 1.014 (ref 1.005–1.030)
pH: 6 (ref 5.0–8.0)

## 2021-08-31 LAB — WET PREP, GENITAL
Clue Cells Wet Prep HPF POC: NONE SEEN
Sperm: NONE SEEN
Trich, Wet Prep: NONE SEEN
WBC, Wet Prep HPF POC: <10 (ref <10)
Yeast Wet Prep HPF POC: NONE SEEN

## 2021-08-31 LAB — CBC
HCT: 35.6 % — ABNORMAL LOW (ref 36.0–46.0)
Hemoglobin: 11.4 g/dL — ABNORMAL LOW (ref 12.0–15.0)
MCH: 25.2 pg — ABNORMAL LOW (ref 26.0–34.0)
MCHC: 32 g/dL (ref 30.0–36.0)
MCV: 78.6 fL — ABNORMAL LOW (ref 80.0–100.0)
Platelets: 162 10*3/uL (ref 150–400)
RBC: 4.53 MIL/uL (ref 3.87–5.11)
RDW: 15.3 % (ref 11.5–15.5)
WBC: 7.1 10*3/uL (ref 4.0–10.5)
nRBC: 0 % (ref 0.0–0.2)

## 2021-08-31 LAB — POCT FERN TEST: POCT Fern Test: NEGATIVE

## 2021-08-31 LAB — TYPE AND SCREEN
ABO/RH(D): A POS
Antibody Screen: NEGATIVE

## 2021-08-31 MED ORDER — HYDROMORPHONE HCL 1 MG/ML IJ SOLN
1.0000 mg | INTRAMUSCULAR | Status: DC | PRN
  Administered 2021-08-31 – 2021-09-01 (×2): 1 mg via INTRAVENOUS
  Filled 2021-08-31 (×2): qty 1

## 2021-08-31 MED ORDER — PRENATAL MULTIVITAMIN CH: 1.0000 | ORAL_TABLET | Freq: Every day | ORAL | Status: DC

## 2021-08-31 MED ORDER — ACETAMINOPHEN 325 MG PO TABS
650.0000 mg | ORAL_TABLET | ORAL | Status: DC | PRN
  Administered 2021-08-31 – 2021-09-01 (×3): 650 mg via ORAL
  Filled 2021-08-31 (×3): qty 2

## 2021-08-31 MED ORDER — LACTATED RINGERS IV SOLN: INTRAVENOUS | Status: DC

## 2021-08-31 NOTE — Progress Notes (Signed)
Called by nurse reporting spontaneous rupture of membrane. Patient reports continued pelvic pressure and cramping. She endorses pain but is reluctant to take pain medication. Patient is without any other complaints  Blood pressure (!) 105/59, pulse 90, temperature 98.6 F (37 C), temperature source Oral, resp. rate 17, height 5\' 3"  (1.6 m), weight 114 kg, last menstrual period 04/25/2021, SpO2 100 %.  GENERAL: Well-developed, well-nourished female in no acute distress.  ABDOMEN: Soft, nontender, nondistended. No organomegaly. PELVIC: Fetal parts palpable through cervical os and into the vagina. EXTREMITIES: No cyanosis, clubbing, or edema, 2+ distal pulses.  A/P 26 yo with incompetent cervix and pre-viable PROM - Patient opted for expectant management for now - Encouraged patient to take pain medication as needed - Will monitor closely for si/sx of chorioamnionitis - Anticipate SVD

## 2021-08-31 NOTE — H&P (Signed)
FACULTY PRACTICE ANTEPARTUM ADMISSION HISTORY AND PHYSICAL NOTE   History of Present Illness: Angela Hammond is a 26 y.o. G2P0000 at 42w2dadmitted for preterm previable labor due to cervical insufficiency.  Starting experiencing cramping yesterday afternoon and some spotting.  Also notes some watery discharge that started a few days ago.   Occasional headache- minimal change with tylenol.   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 08/08/2021   Supervision of high risk pregnancy, antepartum 06/29/2021   Hx of one miscarriage 08/08/2020   [place] ([place]/18[place]    Past Medical History:  Diagnosis Date   Gonorrhea 04/19/2011   History of gonorrhea    2013   [place]   Pelvic inflammatory disease (PID) 04/18/2011   Previable preterm premature rupture of membranes at [redacted] weeks gestation 07/08/2020    Past Surgical History:  Procedure Laterality Date   TONSILLECTOMY     AGE 51    OB History  Gravida Para Term Preterm AB Living  2 1 0 0 0 0  SAB IAB Ectopic Multiple Live Births  0 0 0 0      # Outcome Date GA Lbr Len/2nd Weight Sex Delivery Anes PTL Lv  2 Current           1 Para 07/08/20 148w1d 00:06 196 g M Vag-Breech None  FD    Social History   Socioeconomic History   Marital status: Significant Other    Spouse name: Not on file   Number of children: Not on file   Years of education: Not on file   Highest education level: Not on file  Occupational History   Not on file  Tobacco Use   Smoking status: Never   Smokeless tobacco: Never  Vaping Use   Vaping Use: Never used  Substance and Sexual Activity   Alcohol use: Not Currently   Drug use: No   Sexual activity: Yes    Partners: Male    Comment: pregnant  Other Topics Concern   Not on file  Social History Narrative   Not on file   Social Determinants of Health    Financial Resource Strain: Not on file  Food Insecurity: Food Insecurity Present (07/24/2021)   Hunger Vital Sign    Worried About Running Out of Food in the Last Year: Sometimes true    Ran Out of Food in the Last Year: Sometimes true  Transportation Needs: No Transportation Needs (07/24/2021)   PRAPARE - TrHydrologistMedical): No    Lack of Transportation (Non-Medical): No  Physical Activity: Not on file  Stress: Not on file  Social Connections: Not on file    Family History  Problem Relation Age of Onset   Stroke Mother    Hypertension Mother    Hypertension Father    Diabetes Maternal Aunt    Stroke Maternal Aunt    Hypertension Maternal Aunt    Stroke Maternal Grandfather    Hypertension Maternal Grandfather     No Known Allergies  Medications Prior to Admission  Medication Sig Dispense Refill Last Dose   aspirin EC 81 MG tablet Take 1 tablet (81 mg total) by mouth daily. (Patient not taking: Reported on 08/29/2021) 60 tablet 2    Blood Pressure Monitoring (BLOOD PRESSURE KIT) DEVI 1 Device by Does not apply route as needed. 1 each  0    Misc. Devices (GOJJI WEIGHT SCALE) MISC 1 Device by Does not apply route as needed. (Patient not taking: Reported on 08/31/2021) 1 each 0 Not Taking   Prenatal Vit-Fe Fumarate-FA (PRENATAL PO) Take by mouth. (Patient not taking: Reported on 08/29/2021)      progesterone (PROMETRIUM) 200 MG capsule Place 1 capsule (200 mg total) vaginally at bedtime. (Patient not taking: Reported on 08/29/2021) 60 capsule 2     Review of Systems -  Pertinent items noted in HPI.   Otherwise review of symptoms and negative.  Vitals:  BP 109/72 (BP Location: Right Arm)   Pulse 100   Temp 98.3 F (36.8 C) (Oral)   Resp 18   Ht 5' 3"  (1.6 m)   Wt 114 kg   LMP 04/25/2021   SpO2 98%   BMI 44.52 kg/m   Physical Examination: CONSTITUTIONAL: Well-developed, well-nourished female in no acute distress.  EYES: Conjunctivae and EOM  are normal.  NECK: Normal range of motion, supple, no masses SKIN: Skin is warm and dry. No rash noted. Not diaphoretic. No erythema. No pallor. [place]: Alert and oriented to person, place, and time. PSYCHIATRIC: Normal mood and affect. Normal behavior. Normal judgment and thought content. CARDIOVASCULAR: Normal heart rate noted, regular rhythm RESPIRATORY: Effort and breath sounds normal, no problems with respiration noted ABDOMEN: obese, soft, nontender, non-distended MUSCULOSKELETAL: Normal range of motion. No edema and no tenderness. GU: On speculum exam- large bulging bag noted with pooling of fluid below bag.  On SVE: difficult to assess due to bulging bag, suspect pt is at least 4-5cm dilated  Labs:  Results for orders placed or performed during the hospital encounter of 08/31/21 (from the past 24 hour(s))  Urinalysis, Routine w reflex microscopic Urine, Clean Catch   Collection Time: 08/31/21 10:44 AM  Result Value Ref Range   Color, Urine YELLOW YELLOW   APPearance CLEAR CLEAR   Specific Gravity, Urine 1.014 1.005 - 1.030   pH 6.0 5.0 - 8.0   Glucose, UA NEGATIVE NEGATIVE mg/dL   Hgb urine dipstick NEGATIVE NEGATIVE   Bilirubin Urine NEGATIVE NEGATIVE   Ketones, ur 5 (A) NEGATIVE mg/dL   Protein, ur NEGATIVE NEGATIVE mg/dL   Nitrite NEGATIVE NEGATIVE   Leukocytes,Ua NEGATIVE NEGATIVE    Assessment and Plan: -IUP @ [redacted]w[redacted]d-Preterm labor with cervical insufficiency -Obesity  -Admit to antenatal -Amnisure collected, await results to confirm rupture -Expectant management at this time and may change pending results of amnisure -NPO -LR @ 125cc/hr -oral or IV pain medication as needed  JJanyth Pupa DO Attending [place] F[place]for [facility] [employer] [facility] [place]

## 2021-08-31 NOTE — MAU Provider Note (Signed)
History     [id number]  Arrival date and time: 08/31/21 1005    Chief Complaint  Patient presents with   Vaginal Discharge   HPI Angela Hammond is a 26 y.o. at 43w2dby LMP with PMHx notable for , who presents for previous 2nd trimester pregnancy loss.   She reports she reports no abdominal cramping and clear fluid discharge for the last 2 days.  Also had a blood-tinged when she wiped earlier today. She was here yesterday, for evaluation due to pelvic and rectal pressure as well as cramping.  This was following an ultrasound done by MFM which showed that her cervix was open and fully effaced.  Cervix was 1.5 - 2cm dilated during yesterday's examination.  She had declined in the emergency cerclage yesterday.  Today, she states that she would still like to decline any intervention and allow for a natural course of events. Vaginal bleeding: Yes - mild tinge of blood as above LOF: Yes - concerned about this as above Fetal Movement: n/a - previable Contractions:  - lower abdominal cramping.  She has a history of a second trimester loss at 19 weeks which occurred in 06/2020.  At that time, she presented in previable PPROM and was admitted for misoprostol induction, resulted in delivery of a nonviable female fetus.  A/Positive/-- (07/10 1048)  OB History     Gravida  2   Para  1   Term  0   Preterm  0   AB  0   Living  0      SAB  0   IAB  0   Ectopic  0   Multiple  0   Live Births              Past Medical History:  Diagnosis Date   Gonorrhea 04/19/2011   History of gonorrhea    2013   [place]18[place]   Pelvic inflammatory disease (PID) 04/18/2011   Previable preterm premature rupture of membranes at [redacted] weeks gestation 07/08/2020    Past Surgical History:  Procedure Laterality Date   TONSILLECTOMY     AGE 59    Family History  Problem Relation Age of Onset   Stroke Mother    Hypertension Mother    Hypertension Father    Diabetes  Maternal Aunt    Stroke Maternal Aunt    Hypertension Maternal Aunt    Stroke Maternal Grandfather    Hypertension Maternal Grandfather     Social History   Socioeconomic History   Marital status: Significant Other    Spouse name: Not on file   Number of children: Not on file   Years of education: Not on file   Highest education level: Not on file  Occupational History   Not on file  Tobacco Use   Smoking status: Never   Smokeless tobacco: Never  Vaping Use   Vaping Use: Never used  Substance and Sexual Activity   Alcohol use: Not Currently   Drug use: No   Sexual activity: Yes    Partners: Male    Comment: pregnant  Other Topics Concern   Not on file  Social History Narrative   Not on file   Social Determinants of Health   Financial Resource Strain: Not on file  Food Insecurity: Food Insecurity Present (07/24/2021)   Hunger Vital Sign    Worried About Running Out of Food in the Last Year: Sometimes true    Ran Out of Food in  the Last Year: Sometimes true  Transportation Needs: No Transportation Needs (07/24/2021)   PRAPARE - Hydrologist (Medical): No    Lack of Transportation (Non-Medical): No  Physical Activity: Not on file  Stress: Not on file  Social Connections: Not on file  Intimate Partner Violence: Not on file    No Known Allergies  No current facility-administered medications on file prior to encounter.   Current Outpatient Medications on File Prior to Encounter  Medication Sig Dispense Refill   aspirin EC 81 MG tablet Take 1 tablet (81 mg total) by mouth daily. (Patient not taking: Reported on 08/29/2021) 60 tablet 2   Blood Pressure Monitoring (BLOOD PRESSURE KIT) DEVI 1 Device by Does not apply route as needed. 1 each 0   Misc. Devices (GOJJI WEIGHT SCALE) MISC 1 Device by Does not apply route as needed. (Patient not taking: Reported on 08/31/2021) 1 each 0   Prenatal Vit-Fe Fumarate-FA (PRENATAL PO) Take by mouth.  (Patient not taking: Reported on 08/29/2021)     progesterone (PROMETRIUM) 200 MG capsule Place 1 capsule (200 mg total) vaginally at bedtime. (Patient not taking: Reported on 08/29/2021) 60 capsule 2   [DISCONTINUED] dicyclomine (BENTYL) 20 MG tablet Take 1 tablet (20 mg total) by mouth 2 (two) times daily as needed for spasms. 20 tablet 0   [DISCONTINUED] fluticasone (FLONASE) 50 MCG/ACT nasal spray Place 1 spray into both nostrils daily. 5 g 0     ROS Pertinent positives and negative per HPI, all others reviewed and negative  Physical Exam   BP 109/72 (BP Location: Right Arm)   Pulse 100   Temp 98.3 F (36.8 C) (Oral)   Resp 18   Ht 5' 3"  (1.6 m)   Wt 114 kg   LMP 04/25/2021   SpO2 98%   BMI 44.52 kg/m   Patient Vitals for the past 24 hrs:  BP Temp Temp src Pulse Resp SpO2 Height Weight  08/31/21 1030 109/72 98.3 F (36.8 C) Oral 100 18 98 % -- --  08/31/21 1024 -- -- -- -- -- -- 5' 3"  (1.6 m) 114 kg    Physical Exam Vitals reviewed.  Constitutional:      General: She is not in acute distress.    Appearance: She is well-developed. She is not toxic-appearing.  HENT:     Head: Normocephalic and atraumatic.     Mouth/Throat:     Mouth: Mucous membranes are moist.  Eyes:     Extraocular Movements: Extraocular movements intact.  Cardiovascular:     Rate and Rhythm: Normal rate.  Pulmonary:     Effort: Pulmonary effort is normal. No respiratory distress.  Abdominal:     Palpations: Abdomen is soft.  Genitourinary:    Vagina: Normal.     Comments: Bulging membranes noted in the vagina, emanating from cervical os. Could not assess cervical dilation. Skin:    General: Skin is warm and dry.  Neurological:     Mental Status: She is alert and oriented to person, place, and time.  Psychiatric:        Mood and Affect: Mood normal.        Behavior: Behavior normal.     Cervical Exam Cervix dilated, unable to assess extent, however has intact membranes in the vagina  vault.   FHT  - by bedside doppler per RN - 158 bpm  Labs Results for orders placed or performed during the hospital encounter of 08/31/21 (from the past 24  hour(s))  Urinalysis, Routine w reflex microscopic Urine, Clean Catch     Status: Abnormal   Collection Time: 08/31/21 10:44 AM  Result Value Ref Range   Color, Urine YELLOW YELLOW   APPearance CLEAR CLEAR   Specific Gravity, Urine 1.014 1.005 - 1.030   pH 6.0 5.0 - 8.0   Glucose, UA NEGATIVE NEGATIVE mg/dL   Hgb urine dipstick NEGATIVE NEGATIVE   Bilirubin Urine NEGATIVE NEGATIVE   Ketones, ur 5 (A) NEGATIVE mg/dL   Protein, ur NEGATIVE NEGATIVE mg/dL   Nitrite NEGATIVE NEGATIVE   Leukocytes,Ua NEGATIVE NEGATIVE    Imaging No results found.  MAU Course  Procedures  Lab Orders         Urinalysis, Routine w reflex microscopic Urine, Clean Catch         Fern Test    No orders of the defined types were placed in this encounter.  Imaging Orders  No imaging studies ordered today    MDM moderate  Assessment and Plan  25yo G2P0 with cervical insufficiency, hourglassing of membranes.  Likely inevitable mid trimester loss at this time. Amnisure collected and sent.   Plan is to admit to antepartum for expectant management and pain control.  Definitive plan, pending results of amnisure. Discussed with Dr Nelda Marseille.  Liliane Channel MD MPH OB Fellow, Faculty Practice

## 2021-08-31 NOTE — MAU Note (Signed)
Angela Hammond is a 26 y.o. at [redacted]w[redacted]d here in MAU reporting: cramping and watery clear vaginal discharge that began this morning.  Reports discharge doesn't have an odor.  States discharge has been consistent and wearing pad.  Denies VB, reports spotting with wiping. LMP: N/A Onset of complaint: yesterday Pain score: 8 Vitals:   08/31/21 1030  BP: 109/72  Pulse: 100  Resp: 18  Temp: 98.3 F (36.8 C)  SpO2: 98%     FHT:158 bpm Lab orders placed from triage:   UA

## 2021-09-01 LAB — GC/CHLAMYDIA PROBE AMP (~~LOC~~) NOT AT ARMC
Chlamydia: NEGATIVE
Comment: NEGATIVE
Comment: NORMAL
Neisseria Gonorrhea: NEGATIVE

## 2021-09-01 MED ORDER — IBUPROFEN 600 MG PO TABS: 600.0000 mg | ORAL_TABLET | Freq: Four times a day (QID) | ORAL | 0 refills | Status: DC | PRN

## 2021-09-01 MED ORDER — IBUPROFEN 600 MG PO TABS
600.0000 mg | ORAL_TABLET | Freq: Four times a day (QID) | ORAL | Status: DC | PRN
  Administered 2021-09-01: 600 mg via ORAL
  Filled 2021-09-01: qty 1

## 2021-09-01 MED ORDER — MISOPROSTOL 200 MCG PO TABS
800.0000 ug | ORAL_TABLET | ORAL | Status: DC
  Administered 2021-09-01: 800 ug via RECTAL
  Filled 2021-09-01: qty 4

## 2021-09-01 NOTE — Progress Notes (Signed)
Discharge instructions and prescriptions given to pt. Discussed post vaginal delivery care, signs and symptoms to report to the MD, upcoming appointments, and meds.Pt verbalizes understanding and has no questions or concerns at this time. Pt discharged home from hospital in stable condition. 

## 2021-09-01 NOTE — Progress Notes (Signed)
Patient called RN to room reporting increased pressure, feet of fetus visible. MD constant notified of pending delivery. Fetus delivered non viable by MD constant at 0218. Post delivery care and emotional support provided.

## 2021-09-01 NOTE — Discharge Summary (Signed)
Postpartum Discharge Summary  Date of Service updated-8/18     Patient Name: Angela Hammond DOB: 01-28-1995 MRN: [id number]  Date of admission: 08/31/2021 Delivery date:This patient has no babies on file. Delivering provider: This patient has no babies on file. Date of discharge: 09/01/2021  Admitting diagnosis: Preterm labor in second trimester [O60.02] Intrauterine pregnancy: [redacted]w[redacted]d    Secondary diagnosis:  Principal Problem:   Preterm labor in second trimester  Additional problems: incompetent cervix, PPROM    Discharge diagnosis:  Second trimester pregnancy loss, incompetent cervix                                               Post partum procedures: none  Hospital course: Patient admitted with incompetent cervix and previable labor. Spontaneous rupture of membrane occurred around 11 pm followed by subsequent delivery of a non viable fetus. Placenta delivered following a dose of cytotec, intact on visual inspection. Patient remained stable and experienced minimal vaginal bleeding. She expressed desire for discharge home   MMR:N/A T-DaP: n/a Flu: N/A Transfusion:No  Physical exam  Vitals:   09/01/21 0400 09/01/21 0757 09/01/21 0912 09/01/21 1133  BP: 126/74 (!) 129/45 109/61 112/61  Pulse: 82 79 83 77  Resp: 20 18  18   Temp: 97.7 F (36.5 C) 98 F (36.7 C)  97.9 F (36.6 C)  TempSrc: Oral Oral  Oral  SpO2: 94% 95%  100%  Weight:      Height:       General: alert, cooperative, and no distress Lochia: appropriate Uterine Fundus: firm Incision: N/A DVT Evaluation: No evidence of DVT seen on physical exam. Labs: Lab Results  Component Value Date   WBC 7.1 08/31/2021   HGB 11.4 (L) 08/31/2021   HCT 35.6 (L) 08/31/2021   MCV 78.6 (L) 08/31/2021   PLT 162 08/31/2021      Latest Ref Rng & Units 03/14/2019   10:16 AM  CMP  Glucose 70 - 99 mg/dL 95   BUN 6 - 20 mg/dL 10   Creatinine 0.44 - 1.00 mg/dL 0.86   Sodium 135 - 145 mmol/L 139   Potassium 3.5 -  5.1 mmol/L 4.3   Chloride 98 - 111 mmol/L 105   CO2 22 - 32 mmol/L 23   Calcium 8.9 - 10.3 mg/dL 9.3   Total Protein 6.5 - 8.1 g/dL 7.3   Total Bilirubin 0.3 - 1.2 mg/dL 0.7   Alkaline Phos 38 - 126 U/L 45   AST 15 - 41 U/L 19   ALT 0 - 44 U/L 13    Edinburgh Score:    07/20/2020    3:26 PM  Edinburgh Postnatal Depression Scale Screening Tool  I have been able to laugh and see the funny side of things. 1  I have looked forward with enjoyment to things. 1  I have blamed myself unnecessarily when things went wrong. 2  I have been anxious or worried for no good reason. 2  I have felt scared or panicky for no good reason. 2  Things have been getting on top of me. 2  I have been so unhappy that I have had difficulty sleeping. 2  I have felt sad or miserable. 1  I have been so unhappy that I have been crying. 1  The thought of harming myself has occurred to me. 0  Edinburgh Postnatal Depression Scale Total 14     After visit meds:  Allergies as of 09/01/2021   No Known Allergies      Medication List     STOP taking these medications    aspirin EC 81 MG tablet   Blood Pressure Kit Devi   Gojji Weight Scale Misc   PRENATAL PO   progesterone 200 MG capsule Commonly known as: PROMETRIUM       TAKE these medications    ibuprofen 600 MG tablet Commonly known as: ADVIL Take 1 tablet (600 mg total) by mouth every 6 (six) hours as needed for fever or headache.         Discharge home in stable condition Infant [place] Discharge instruction: per After Visit Summary and Postpartum booklet. Activity: Advance as tolerated. Pelvic rest for 6 weeks.  Diet: routine diet Future Appointments: Future Appointments  Date Time Provider [place]  09/12/2021  9:35 AM Danielle Rankin [facility]   Follow up Visit:  [place] for [facility] Follow up on 09/12/2021.   Specialty:  Obstetrics and Gynecology Why: Keep appointment for postpartum check Contact information: [place] [id number] [phone]                 Please schedule this patient for a In person postpartum visit on 09/12/21 with the following provider: Any provider. Additional Postpartum F/U:Postpartum Depression checkup  Anticipated Birth Control:   pt declined  Janyth Pupa, DO Attending [place], [place] for [employer], [place]

## 2021-09-01 NOTE — Progress Notes (Signed)
Called by nurse reporting vaginal delivery. Patient found with non-viable fetus delivered en caul on the perineum. Cord clamped and cut.  Plan for cytotec until delivery of the placenta Pain management prn Emotional support provided

## 2021-09-01 NOTE — Progress Notes (Signed)
Patient reports increasing pelvic pressure.   Pelvic: Portion of placenta palpated through cervical os into vagina. With one push, placenta delivered whole and intact  Approximately 50 cc of dark blood followed.  Patient stable and interested in early discharge. Will monitor patient for a few hours and consider discharge home around noon

## 2021-09-04 LAB — SURGICAL PATHOLOGY

## 2021-09-12 ENCOUNTER — Telehealth (INDEPENDENT_AMBULATORY_CARE_PROVIDER_SITE_OTHER): Payer: Medicaid Other | Admitting: Medical

## 2021-09-12 ENCOUNTER — Encounter: Payer: Self-pay | Admitting: Medical

## 2021-09-12 DIAGNOSIS — Z1332 Encounter for screening for maternal depression: Secondary | ICD-10-CM

## 2021-09-12 NOTE — Progress Notes (Signed)
Virtual Visit via Video Note  I connected with Angela Hammond on 09/12/21 at  9:35 AM EDT by a video enabled telemedicine application and verified that I am speaking with the correct person using two identifiers.  Location: Patient: home Provider: [facility]   I discussed the limitations of evaluation and management by telemedicine and the availability of in person appointments. The patient expressed understanding and agreed to proceed.  History of Present Illness:    Observations/Objective:   Assessment and Plan:   Follow Up Instructions:    I discussed the assessment and treatment plan with the patient. The patient was provided an opportunity to ask questions and all were answered. The patient agreed with the plan and demonstrated an understanding of the instructions.   The patient was advised to call back or seek an in-person evaluation if the symptoms worsen or if the condition fails to improve as anticipated.  I provided 7 minutes of non-face-to-face time during this encounter.   Aviva Signs, CMA

## 2021-09-12 NOTE — Progress Notes (Signed)
   History:  [name] is a 26 y.o. G2P0010 who presents for a virtual mood check today following a 18 week delivery due to cervical incompetence and PPROM. I am seeing the patient via MyChart video. The patient states that she has returned to work and feels that is a good distraction for her. She is still having intermittent issues with sleep, but feels that overall depressed mood and emotional swings have improved since discharge. EDPS was 14 at discharge. She denies SI or HI today. She states bleeding has stopped and she denies pain today. She declines birth control at this time.   The following portions of the patient's history were reviewed and updated as appropriate: allergies, current medications, family history, past medical history, social history, past surgical history and problem list.  Review of Systems:  Review of Systems  Constitutional:  Negative for fever.  Gastrointestinal:  Negative for abdominal pain.  Genitourinary:        Neg - vaginal bleeding      Objective:  Physical Exam Breastfeeding Unknown  Physical Exam Constitutional:      Appearance: Normal appearance.  Pulmonary:     Effort: Pulmonary effort is normal.  Neurological:     Mental Status: She is alert.  Psychiatric:        Attention and Perception: Attention normal.        Mood and Affect: Mood is depressed.        Speech: Speech normal.        Behavior: Behavior normal.        Thought Content: Thought content normal.     Health Maintenance Due  Topic Date Due   COVID-19 Vaccine (1) Never done   TETANUS/TDAP  Never done   PAP-Cervical Cytology Screening  11/04/2014   PAP SMEAR-Modifier  11/04/2014   INFLUENZA VACCINE  08/15/2021    Labs, imaging and previous visits in Epic reviewed  Assessment & Plan:  Preterm delivery in second trimester of non-viable fetus at [redacted] weeks gestation  PPROM Cervical incompetence   [facility] will check-in with patient in 1-2 weeks for mood check Patient  advised that [facility] referral can be placed at anytime  Patient declined birth control at this, advised to call office if she desires birth control initiation in the future  Condolences given for pregnancy loss. Patient is appropriately tearful.    Marny Lowenstein, PA-C 09/12/2021 10:14 AM

## 2021-09-20 ENCOUNTER — Telehealth: Payer: Self-pay | Admitting: Clinical

## 2021-09-20 NOTE — Telephone Encounter (Signed)
Brief mood check after recent loss; some sleep difficulty and at times poor appetite after loss, but feels she is coping well; pt declines appointment at this time; agrees to call [facility] at [phone] as needed in the future.

## 2022-01-12 ENCOUNTER — Encounter (HOSPITAL_COMMUNITY): Payer: Self-pay

## 2022-01-12 ENCOUNTER — Ambulatory Visit (HOSPITAL_COMMUNITY)
Admission: RE | Admit: 2022-01-12 | Discharge: 2022-01-12 | Disposition: A | Discharge status: Home / Self Care | Payer: Medicaid Other | Source: Ambulatory Visit | Attending: Nurse Practitioner | Admitting: Nurse Practitioner

## 2022-01-12 VITALS — BP 114/82 | HR 77 | Temp 98.0°F | Resp 16

## 2022-01-12 DIAGNOSIS — Z113 Encounter for screening for infections with a predominantly sexual mode of transmission: Secondary | ICD-10-CM | POA: Diagnosis present

## 2022-01-12 DIAGNOSIS — Z202 Contact with and (suspected) exposure to infections with a predominantly sexual mode of transmission: Secondary | ICD-10-CM

## 2022-01-12 LAB — HIV ANTIBODY (ROUTINE TESTING W REFLEX): HIV Screen 4th Generation wRfx: NONREACTIVE

## 2022-01-12 NOTE — ED Provider Notes (Signed)
[facility]    CSN: [id number] Arrival date & time: 01/12/22  6503      History   Chief Complaint Chief Complaint  Patient presents with   Exposure to STD    HPI Angela Hammond is a 26 y.o. female.   Subjective:   Angela Hammond is a 26 y.o. female who presents for routine STD testing. Patient currently denies any symptoms.  She reports having a history of 50 female partners but currently sexually active with one female partner.  She is not on birth control.  She inconsistently uses condoms.  Last menstrual period 12/29/2021 and was normal.  Patient does have a history of gonorrhea back in her teenage years.    The following portions of the patient's history were reviewed and updated as appropriate: allergies, current medications, past family history, past medical history, past social history, past surgical history, and problem list.         Past Medical History:  Diagnosis Date   Gonorrhea 04/19/2011   History of gonorrhea    2013   Morbid obesity (HCC) 04/18/2011   Pelvic inflammatory disease (PID) 04/18/2011   Previable preterm premature rupture of membranes at [redacted] weeks gestation 07/08/2020    Patient Active Problem List   Diagnosis Date Noted   Preterm labor in second trimester 08/31/2021   Alpha thalassemia silent carrier 08/08/2021   Genetic carrier - at risk of SMA carrier 08/08/2021   Hx of one miscarriage 08/08/2020   Morbid obesity (HCC) 04/18/2011    Past Surgical History:  Procedure Laterality Date   TONSILLECTOMY     AGE 31    OB History     Gravida  1   Para  1   Term  0   Preterm  0   AB  0   Living  0      SAB  0   IAB  0   Ectopic  0   Multiple  0   Live Births  0            Home Medications    Prior to Admission medications   Medication Sig Start Date End Date Taking? Authorizing Provider  dicyclomine (BENTYL) 20 MG tablet Take 1 tablet (20 mg total) by mouth 2 (two) times daily as needed for spasms.  03/17/18 01/27/19  Caccavale, Sophia, PA-C  fluticasone (FLONASE) 50 MCG/ACT nasal spray Place 1 spray into both nostrils daily. 10/12/17 01/27/19  Emi Holes, PA-C    Family History Family History  Problem Relation Age of Onset   Stroke Mother    Hypertension Mother    Hypertension Father    Diabetes Maternal Aunt    Stroke Maternal Aunt    Hypertension Maternal Aunt    Stroke Maternal Grandfather    Hypertension Maternal Grandfather     Social History Social History   Tobacco Use   Smoking status: Never   Smokeless tobacco: Never  Vaping Use   Vaping Use: Never used  Substance Use Topics   Alcohol use: Not Currently   Drug use: No     Allergies   Patient has no known allergies.   Review of Systems Review of Systems  Constitutional:  Negative for fever.  Genitourinary:  Negative for dysuria, frequency, genital sores, menstrual problem, pelvic pain, vaginal discharge and vaginal pain.  All other systems reviewed and are negative.    Physical Exam Triage Vital Signs ED Triage Vitals  Enc Vitals Group  BP 01/12/22 0908 114/82     Pulse Rate 01/12/22 0908 77     Resp 01/12/22 0908 16     Temp 01/12/22 0908 98 F (36.7 C)     Temp Source 01/12/22 0908 Oral     SpO2 01/12/22 0908 98 %     Weight --      Height --      Head Circumference --      Peak Flow --      Pain Score 01/12/22 0909 0     Pain Loc --      Pain Edu? --      Excl. in GC? --    No data found.  Updated Vital Signs BP 114/82 (BP Location: Left Arm)   Pulse 77   Temp 98 F (36.7 C) (Oral)   Resp 16   LMP 12/29/2021   SpO2 98%   Visual Acuity Right Eye Distance:   Left Eye Distance:   Bilateral Distance:    Right Eye Near:   Left Eye Near:    Bilateral Near:     Physical Exam Vitals reviewed.  Constitutional:      Appearance: Normal appearance.  HENT:     Head: Normocephalic.  Cardiovascular:     Rate and Rhythm: Normal rate and regular rhythm.  Pulmonary:      Effort: Pulmonary effort is normal.     Breath sounds: Normal breath sounds.  Genitourinary:    Comments: GU exam deferred.  Patient perform self swab for testing Musculoskeletal:        General: Normal range of motion.     Cervical back: Normal range of motion.  Skin:    General: Skin is warm and dry.  Neurological:     General: No focal deficit present.     Mental Status: She is alert and oriented to person, place, and time.      UC Treatments / Results  Labs (all labs ordered are listed, but only abnormal results are displayed) Labs Reviewed  HIV ANTIBODY (ROUTINE TESTING W REFLEX)  RPR  CERVICOVAGINAL ANCILLARY ONLY    EKG   Radiology No results found.  Procedures Procedures (including critical care time)  Medications Ordered in UC Medications - No data to display  Initial Impression / Assessment and Plan / UC Course  I have reviewed the triage vital signs and the nursing notes.  Pertinent labs & imaging results that were available during my care of the patient were reviewed by me and considered in my medical decision making (see chart for details).    26 year old female presenting for STD testing. She denies any symtpoms at this time.  HIV, syphilis, gonorrhea, chlamydia and trichomonas testing pending at this time. Transport planner distributed. Abstinence from intercourse discussed. Discussed safe sex and PrEP therapy.  Today's evaluation has revealed no signs of a dangerous process. Discussed diagnosis with patient and/or guardian. Patient and/or guardian aware of their diagnosis, possible red flag symptoms to watch out for and need for close follow up. Patient and/or guardian understands verbal and written discharge instructions. Patient and/or guardian comfortable with plan and disposition.  Patient and/or guardian has a clear mental status at this time, good insight into illness (after discussion and teaching) and has clear judgment to make decisions  regarding their care  Documentation was completed with the aid of voice recognition software. Transcription may contain typographical errors. Final Clinical Impressions(s) / UC Diagnoses   Final diagnoses:  Screen for STD (sexually transmitted disease)  Discharge Instructions      Testing for syphilis, HIV, gonorrhea, chlamydia and trichomonas is pending. You should not have any sexual activity until you receive the results of the tests. You will only be notified for positive results. You may go online to MyChart and review your results. Practice safe sex practices by wearing a condom every time you have sex. Remember that people who have STIs may not experience any symptoms. However, even without symptoms, these infections can be spread from person to person and require treatment. STIs can be treated, and many STIs can be cured. However, some STIs cannot be cured and will affect you for the rest of your life. It's important to be checked regularly for STIs. You should also consider taking pre-exposure prophylaxis (PrEP) to prevent HIV infection.     ED Prescriptions   None    PDMP not reviewed this encounter.   Lurline Idol, [place] 01/12/22 1012

## 2022-01-12 NOTE — ED Triage Notes (Signed)
Pt is here for STD check as a preventive  

## 2022-01-12 NOTE — Discharge Instructions (Signed)
Testing for syphilis, HIV, gonorrhea, chlamydia and trichomonas is pending. You should not have any sexual activity until you receive the results of the tests. You will only be notified for positive results. You may go online to MyChart and review your results. Practice safe sex practices by wearing a condom every time you have sex. Remember that people who have STIs may not experience any symptoms. However, even without symptoms, these infections can be spread from person to person and require treatment. STIs can be treated, and many STIs can be cured. However, some STIs cannot be cured and will affect you for the rest of your life. It's important to be checked regularly for STIs. You should also consider taking pre-exposure prophylaxis (PrEP) to prevent HIV infection.

## 2022-01-13 LAB — RPR: RPR Ser Ql: NONREACTIVE

## 2022-01-16 LAB — CERVICOVAGINAL ANCILLARY ONLY
Chlamydia: NEGATIVE
Comment: NEGATIVE
Comment: NEGATIVE
Comment: NORMAL
Neisseria Gonorrhea: NEGATIVE
Trichomonas: NEGATIVE

## 2022-03-24 ENCOUNTER — Ambulatory Visit (HOSPITAL_COMMUNITY)
Admission: EM | Admit: 2022-03-24 | Discharge: 2022-03-24 | Disposition: A | Discharge status: Home / Self Care | Payer: Medicaid Other | Attending: Family Medicine | Admitting: Family Medicine

## 2022-03-24 ENCOUNTER — Encounter (HOSPITAL_COMMUNITY): Payer: Self-pay

## 2022-03-24 DIAGNOSIS — Z113 Encounter for screening for infections with a predominantly sexual mode of transmission: Secondary | ICD-10-CM | POA: Insufficient documentation

## 2022-03-24 LAB — HIV ANTIBODY (ROUTINE TESTING W REFLEX): HIV Screen 4th Generation wRfx: NONREACTIVE

## 2022-03-24 NOTE — Discharge Instructions (Signed)
We have sent testing for sexually transmitted infections. We will notify you of any positive results once they are received. If required, we will prescribe any medications you might need.  Please refrain from all sexual activity for at least the next seven days.  

## 2022-03-24 NOTE — ED Triage Notes (Signed)
Patient reports that she is wanting STD testing. When asked about vaginal drainage or lower abdominal pain, patient stated, "Not really. Obviously I am sexually active."

## 2022-03-24 NOTE — ED Provider Notes (Addendum)
[facility]   [id number] 03/24/22 Arrival Time: 95  ASSESSMENT & PLAN:  1. Screening for STDs (sexually transmitted diseases)     Discharge Instructions      We have sent testing for sexually transmitted infections. We will notify you of any positive results once they are received. If required, we will prescribe any medications you might need.  Please refrain from all sexual activity for at least the next seven days.  Without s/s of PID.  Labs Ordered  HIV ANTIBODY (ROUTINE TESTING W REFLEX)  RPR  CERVICOVAGINAL ANCILLARY ONLY    Will notify of any positive results. Instructed to refrain from sexual activity for at least seven days.  Reviewed expectations re: course of current medical issues. Questions answered. Outlined signs and symptoms indicating need for more acute intervention. Patient verbalized understanding. After Visit Summary given.   SUBJECTIVE:  Angela Hammond is a 27 y.o. female who requests STD screening. No symptoms. Is sexually active. Afebrile. No abdominal or pelvic pain. Normal PO intake wihout n/v. No genital rashes or lesions.  Patient's last menstrual period was 02/24/2022 (approximate).   OBJECTIVE:  Vitals:   03/24/22 1044  BP: 124/88  Pulse: (!) 55  Resp: 16  Temp: 98.2 F (36.8 C)  TempSrc: Oral  SpO2: 99%     General appearance: alert, cooperative, appears stated age and no distress Lungs: unlabored respirations; speaks full sentences without difficulty GU: deferred Skin: warm and dry Psychological: alert and cooperative; normal mood and affect.    Labs Reviewed  HIV ANTIBODY (ROUTINE TESTING W REFLEX)  RPR  CERVICOVAGINAL ANCILLARY ONLY    No Known Allergies  Past Medical History:  Diagnosis Date   Gonorrhea 04/19/2011   History of gonorrhea    2013   Morbid obesity ([place]) 04/18/2011   Pelvic inflammatory disease (PID) 04/18/2011   Previable preterm premature rupture of membranes at [redacted] weeks  gestation 07/08/2020   Family History  Problem Relation Age of Onset   Stroke Mother    Hypertension Mother    Hypertension Father    Diabetes Maternal Aunt    Stroke Maternal Aunt    Hypertension Maternal Aunt    Stroke Maternal Grandfather    Hypertension Maternal Grandfather    Social History   Socioeconomic History   Marital status: Significant Other    Spouse name: Not on file   Number of children: Not on file   Years of education: Not on file   Highest education level: Not on file  Occupational History   Not on file  Tobacco Use   Smoking status: Never   Smokeless tobacco: Never  Vaping Use   Vaping Use: Never used  Substance and Sexual Activity   Alcohol use: Not Currently   Drug use: No   Sexual activity: Yes    Partners: Male    Comment: pregnant  Other Topics Concern   Not on file  Social History Narrative   Not on file   Social Determinants of Health   Financial Resource Strain: Not on file  Food Insecurity: Food Insecurity Present (07/24/2021)   Hunger Vital Sign    Worried About Running Out of Food in the Last Year: Sometimes true    Ran Out of Food in the Last Year: Sometimes true  Transportation Needs: No Transportation Needs (07/24/2021)   PRAPARE - Hydrologist (Medical): No    Lack of Transportation (Non-Medical): No  Physical Activity: Not on file  Stress: Not  on file  Social Connections: Not on file  Intimate Partner Violence: Not on file           Vanessa Kick, MD 03/24/22 [place], MD 03/24/22 1125

## 2022-03-25 LAB — RPR: RPR Ser Ql: NONREACTIVE

## 2022-03-26 LAB — CERVICOVAGINAL ANCILLARY ONLY
Bacterial Vaginitis (gardnerella): POSITIVE — AB
Candida Glabrata: POSITIVE — AB
Candida Vaginitis: NEGATIVE
Chlamydia: NEGATIVE
Comment: NEGATIVE
Comment: NEGATIVE
Comment: NEGATIVE
Comment: NEGATIVE
Comment: NEGATIVE
Comment: NORMAL
Neisseria Gonorrhea: NEGATIVE
Trichomonas: NEGATIVE

## 2022-03-27 ENCOUNTER — Telehealth (HOSPITAL_COMMUNITY): Payer: Self-pay | Admitting: Emergency Medicine

## 2022-03-27 MED ORDER — METRONIDAZOLE 0.75 % VA GEL: 1.0000 | Freq: Every day | VAGINAL | 0 refills | Status: AC

## 2022-03-27 MED ORDER — FLUCONAZOLE 150 MG PO TABS: 150.0000 mg | ORAL_TABLET | Freq: Once | ORAL | 0 refills | Status: AC

## 2022-06-10 ENCOUNTER — Encounter (HOSPITAL_COMMUNITY): Payer: Self-pay | Admitting: Emergency Medicine

## 2022-06-10 ENCOUNTER — Ambulatory Visit (HOSPITAL_COMMUNITY)
Admission: EM | Admit: 2022-06-10 | Discharge: 2022-06-10 | Disposition: A | Discharge status: Home / Self Care | Payer: Medicaid Other | Attending: Internal Medicine | Admitting: Internal Medicine

## 2022-06-10 DIAGNOSIS — K047 Periapical abscess without sinus: Secondary | ICD-10-CM

## 2022-06-10 MED ORDER — AMOXICILLIN-POT CLAVULANATE 875-125 MG PO TABS: 1.0000 | ORAL_TABLET | Freq: Two times a day (BID) | ORAL | 0 refills | Status: DC

## 2022-06-10 MED ORDER — IBUPROFEN 800 MG PO TABS: 800.0000 mg | ORAL_TABLET | Freq: Three times a day (TID) | ORAL | 0 refills | Status: DC

## 2022-06-10 NOTE — ED Provider Notes (Signed)
[facility]    CSN: [id number] Arrival date & time: 06/10/22  1114      History   Chief Complaint Chief Complaint  Patient presents with   Facial Swelling    HPI Angela Hammond is a 27 y.o. female.   Patient presents to urgent care for evaluation of left-sided facial swelling and dental pain to the left lower mouth that started 3 to 4 days ago.  She states that she is scheduled to have 2 wisdom teeth to the lower left back of her mouth removed on July 11, 2022.  Swelling has worsened and improved intermittently over the last 3 to 4 days but the face has become tight and painful over the last 12 to 24 hours.  Area of swelling is warm and tender to touch.  This has happened in the past when she has had to get her right posterior wisdom teeth removed.  Denies recent antibiotic/steroid use.  Denies history of immunosuppression.  Tolerating food and fluids well without nausea, vomiting, throat swelling, throat pain, or difficulty swallowing.  She does report she is having to eat soft foods due to the pain to the back of her mouth.  Currently taking Tylenol/ibuprofen as needed for pain.  No recent fevers or chills.     Past Medical History:  Diagnosis Date   Gonorrhea 04/19/2011   History of gonorrhea    2013   Morbid obesity (HCC) 04/18/2011   Pelvic inflammatory disease (PID) 04/18/2011   Previable preterm premature rupture of membranes at [redacted] weeks gestation 07/08/2020    Patient Active Problem List   Diagnosis Date Noted   Preterm labor in second trimester 08/31/2021   Alpha thalassemia silent carrier 08/08/2021   Genetic carrier - at risk of SMA carrier 08/08/2021   Hx of one miscarriage 08/08/2020   Morbid obesity (HCC) 04/18/2011    Past Surgical History:  Procedure Laterality Date   TONSILLECTOMY     AGE 15    OB History     Gravida  1   Para  1   Term  0   Preterm  0   AB  0   Living  0      SAB  0   IAB  0   Ectopic  0   Multiple   0   Live Births  0            Home Medications    Prior to Admission medications   Medication Sig Start Date End Date Taking? Authorizing Provider  amoxicillin-clavulanate (AUGMENTIN) 875-125 MG tablet Take 1 tablet by mouth every 12 (twelve) hours. 06/10/22  Yes Carlisle Beers, FNP  ibuprofen (ADVIL) 800 MG tablet Take 1 tablet (800 mg total) by mouth 3 (three) times daily. 06/10/22  Yes Carlisle Beers, FNP  dicyclomine (BENTYL) 20 MG tablet Take 1 tablet (20 mg total) by mouth 2 (two) times daily as needed for spasms. 03/17/18 01/27/19  Caccavale, Sophia, PA-C  fluticasone (FLONASE) 50 MCG/ACT nasal spray Place 1 spray into both nostrils daily. 10/12/17 01/27/19  Emi Holes, PA-C    Family History Family History  Problem Relation Age of Onset   Stroke Mother    Hypertension Mother    Hypertension Father    Diabetes Maternal Aunt    Stroke Maternal Aunt    Hypertension Maternal Aunt    Stroke Maternal Grandfather    Hypertension Maternal Grandfather     Social History Social History  Tobacco Use   Smoking status: Never   Smokeless tobacco: Never  Vaping Use   Vaping Use: Never used  Substance Use Topics   Alcohol use: Not Currently   Drug use: No     Allergies   Patient has no known allergies.   Review of Systems Review of Systems Per HPI  Physical Exam Triage Vital Signs ED Triage Vitals  Enc Vitals Group     BP 06/10/22 1158 123/84     Pulse Rate 06/10/22 1158 87     Resp 06/10/22 1158 17     Temp 06/10/22 1158 99.1 F (37.3 C)     Temp Source 06/10/22 1158 Oral     SpO2 06/10/22 1158 98 %     Weight --      Height --      Head Circumference --      Peak Flow --      Pain Score 06/10/22 1156 10     Pain Loc --      Pain Edu? --      Excl. in GC? --    No data found.  Updated Vital Signs BP 123/84 (BP Location: Right Arm)   Pulse 87   Temp 99.1 F (37.3 C) (Oral)   Resp 17   LMP 05/22/2022   SpO2 98%   Visual  Acuity Right Eye Distance:   Left Eye Distance:   Bilateral Distance:    Right Eye Near:   Left Eye Near:    Bilateral Near:     Physical Exam Vitals and nursing note reviewed.  Constitutional:      Appearance: She is not ill-appearing or toxic-appearing.  HENT:     Head: Normocephalic and atraumatic.     Right Ear: Hearing and external ear normal.     Left Ear: Hearing and external ear normal.     Nose: Nose normal.     Mouth/Throat:     Lips: Pink.     Mouth: Mucous membranes are moist. No injury.     Dentition: Abnormal dentition. Dental tenderness and dental abscesses present.     Tongue: No lesions. Tongue does not deviate from midline.     Palate: No mass and lesions.     Pharynx: Oropharynx is clear. Uvula midline. No pharyngeal swelling, oropharyngeal exudate, posterior oropharyngeal erythema or uvula swelling.     Tonsils: No tonsillar exudate or tonsillar abscesses.      Comments: Maintaining secretions without difficulty, no drooling.  Voice sounds normal.  Obvious dental abscess to the left mouth that is indurated, warm, and tender to palpation.  See images below for further details. Eyes:     General: Lids are normal. Vision grossly intact. Gaze aligned appropriately.     Extraocular Movements: Extraocular movements intact.     Conjunctiva/sclera: Conjunctivae normal.  Cardiovascular:     Rate and Rhythm: Normal rate and regular rhythm.     Heart sounds: Normal heart sounds, S1 normal and S2 normal.  Pulmonary:     Effort: Pulmonary effort is normal. No respiratory distress.     Breath sounds: Normal breath sounds and air entry.  Musculoskeletal:     Cervical back: Neck supple.  Skin:    General: Skin is warm and dry.     Capillary Refill: Capillary refill takes less than 2 seconds.     Findings: No rash.  Neurological:     General: No focal deficit present.     Mental Status: She is alert and  oriented to person, place, and time. Mental status is at  baseline.     Cranial Nerves: No dysarthria or facial asymmetry.  Psychiatric:        Mood and Affect: Mood normal.        Speech: Speech normal.        Behavior: Behavior normal.        Thought Content: Thought content normal.        Judgment: Judgment normal.           UC Treatments / Results  Labs (all labs ordered are listed, but only abnormal results are displayed) Labs Reviewed - No data to display  EKG   Radiology No results found.  Procedures Procedures (including critical care time)  Medications Ordered in UC Medications - No data to display  Initial Impression / Assessment and Plan / UC Course  I have reviewed the triage vital signs and the nursing notes.  Pertinent labs & imaging results that were available during my care of the patient were reviewed by me and considered in my medical decision making (see chart for details).   1.  Dental abscess HEENT exam is stable despite dental abscess.  No posterior oropharyngeal swelling or obstruction.  Maintaining secretions without drooling.  Will use Augmentin antibiotic twice daily for 7 days to treat dental abscess.  Encouraged warm compresses and ibuprofen 800 mg every 8 hours as needed for pain/swelling.  Advised to follow-up with dentist as scheduled on July 11, 2022.  She may call to get on a cancellation list for an earlier appointment as well.  Strict ER and urgent care return precautions discussed should she develop any shortness of breath, throat swelling, throat tightness, difficulty swallowing, drooling, or any other new or worsening symptoms in the next 12 to 24 hours.  If symptoms fail to improve the next 24 hours, advised to go to the ER for advanced imaging.  She is agreeable plan.  Discussed physical exam and available lab work findings in clinic with patient.  Counseled patient regarding appropriate use of medications and potential side effects for all medications recommended or prescribed today.  Discussed red flag signs and symptoms of worsening condition,when to call the PCP office, return to urgent care, and when to seek higher level of care in the emergency department. Patient verbalizes understanding and agreement with plan. All questions answered. Patient discharged in stable condition.    Final Clinical Impressions(s) / UC Diagnoses   Final diagnoses:  Dental abscess     Discharge Instructions      Take Augmentin antibiotic twice a day for the next 7 days to treat your dental abscess.  You may take ibuprofen 800 mg every 8 hours as needed for facial pain and swelling.   Apply warm compresses to the abscess to relieve swelling and pain.  Follow-up with dentist as scheduled in 1 month for tooth extraction.  If your symptoms do not improve in the next 12 to 24 hours with use of antibiotic, I would like for you to go to the nearest emergency room for further evaluation and management of your abscess.  If you begin to experience shortness of breath, chest pain, throat swelling, inability to swallow, or worsening/muffled voice sounds, please go to the nearest emergency room for further evaluation.  I hope you feel better!     ED Prescriptions     Medication Sig Dispense Auth. Provider   amoxicillin-clavulanate (AUGMENTIN) 875-125 MG tablet Take 1 tablet by mouth every 12 (  twelve) hours. 14 tablet Reita May M, FNP   ibuprofen (ADVIL) 800 MG tablet Take 1 tablet (800 mg total) by mouth 3 (three) times daily. 21 tablet Carlisle Beers, FNP      PDMP not reviewed this encounter.   Carlisle Beers, [place] 06/10/22 1229

## 2022-06-10 NOTE — ED Triage Notes (Signed)
Pt c/o left facial swelling and dental pain for several days. Took ibuprofen.

## 2022-06-10 NOTE — Discharge Instructions (Signed)
Take Augmentin antibiotic twice a day for the next 7 days to treat your dental abscess.  You may take ibuprofen 800 mg every 8 hours as needed for facial pain and swelling.   Apply warm compresses to the abscess to relieve swelling and pain.  Follow-up with dentist as scheduled in 1 month for tooth extraction.  If your symptoms do not improve in the next 12 to 24 hours with use of antibiotic, I would like for you to go to the nearest emergency room for further evaluation and management of your abscess.  If you begin to experience shortness of breath, chest pain, throat swelling, inability to swallow, or worsening/muffled voice sounds, please go to the nearest emergency room for further evaluation.  I hope you feel better!

## 2022-06-14 ENCOUNTER — Emergency Department (HOSPITAL_COMMUNITY): Payer: Medicaid Other

## 2022-06-14 ENCOUNTER — Inpatient Hospital Stay (HOSPITAL_COMMUNITY)
Admission: EM | Admit: 2022-06-14 | Discharge: 2022-06-17 | DRG: 158 | Disposition: A | Discharge status: Home / Self Care | Payer: Medicaid Other | Attending: Internal Medicine | Admitting: Internal Medicine

## 2022-06-14 ENCOUNTER — Other Ambulatory Visit: Payer: Self-pay

## 2022-06-14 DIAGNOSIS — D509 Iron deficiency anemia, unspecified: Secondary | ICD-10-CM | POA: Diagnosis present

## 2022-06-14 DIAGNOSIS — Z833 Family history of diabetes mellitus: Secondary | ICD-10-CM

## 2022-06-14 DIAGNOSIS — Z823 Family history of stroke: Secondary | ICD-10-CM

## 2022-06-14 DIAGNOSIS — D539 Nutritional anemia, unspecified: Secondary | ICD-10-CM | POA: Diagnosis not present

## 2022-06-14 DIAGNOSIS — L0201 Cutaneous abscess of face: Secondary | ICD-10-CM

## 2022-06-14 DIAGNOSIS — M272 Inflammatory conditions of jaws: Secondary | ICD-10-CM | POA: Diagnosis present

## 2022-06-14 DIAGNOSIS — Z8249 Family history of ischemic heart disease and other diseases of the circulatory system: Secondary | ICD-10-CM | POA: Diagnosis not present

## 2022-06-14 DIAGNOSIS — K122 Cellulitis and abscess of mouth: Secondary | ICD-10-CM | POA: Diagnosis present

## 2022-06-14 DIAGNOSIS — F17290 Nicotine dependence, other tobacco product, uncomplicated: Secondary | ICD-10-CM | POA: Diagnosis present

## 2022-06-14 DIAGNOSIS — E876 Hypokalemia: Secondary | ICD-10-CM | POA: Diagnosis present

## 2022-06-14 DIAGNOSIS — K047 Periapical abscess without sinus: Principal | ICD-10-CM

## 2022-06-14 HISTORY — DX: Inflammatory conditions of jaws: M27.2

## 2022-06-14 LAB — MAGNESIUM: Magnesium: 2.1 mg/dL (ref 1.7–2.4)

## 2022-06-14 LAB — I-STAT BETA HCG BLOOD, ED (MC, WL, AP ONLY): I-stat hCG, quantitative: <5 m[IU]/mL (ref <5)

## 2022-06-14 LAB — CBC WITH DIFFERENTIAL/PLATELET
Abs Immature Granulocytes: 0.02 10*3/uL (ref 0.00–0.07)
Basophils Absolute: 0 10*3/uL (ref 0.0–0.1)
Basophils Relative: 0 %
Eosinophils Absolute: 0.1 10*3/uL (ref 0.0–0.5)
Eosinophils Relative: 1 %
HCT: 30.1 % — ABNORMAL LOW (ref 36.0–46.0)
Hemoglobin: 9.3 g/dL — ABNORMAL LOW (ref 12.0–15.0)
Immature Granulocytes: 0 %
Lymphocytes Relative: 24 %
Lymphs Abs: 1.8 10*3/uL (ref 0.7–4.0)
MCH: 23.5 pg — ABNORMAL LOW (ref 26.0–34.0)
MCHC: 30.9 g/dL (ref 30.0–36.0)
MCV: 76.2 fL — ABNORMAL LOW (ref 80.0–100.0)
Monocytes Absolute: 1 10*3/uL (ref 0.1–1.0)
Monocytes Relative: 13 %
Neutro Abs: 4.5 10*3/uL (ref 1.7–7.7)
Neutrophils Relative %: 62 %
Platelets: 246 10*3/uL (ref 150–400)
RBC: 3.95 MIL/uL (ref 3.87–5.11)
RDW: 16.9 % — ABNORMAL HIGH (ref 11.5–15.5)
WBC: 7.4 10*3/uL (ref 4.0–10.5)
nRBC: 0 % (ref 0.0–0.2)

## 2022-06-14 LAB — BASIC METABOLIC PANEL
Anion gap: 11 (ref 5–15)
BUN: 5 mg/dL — ABNORMAL LOW (ref 6–20)
CO2: 22 mmol/L (ref 22–32)
Calcium: 8.5 mg/dL — ABNORMAL LOW (ref 8.9–10.3)
Chloride: 104 mmol/L (ref 98–111)
Creatinine, Ser: 0.77 mg/dL (ref 0.44–1.00)
GFR, Estimated: >60 mL/min (ref >60)
Glucose, Bld: 89 mg/dL (ref 70–99)
Potassium: 2.9 mmol/L — ABNORMAL LOW (ref 3.5–5.1)
Sodium: 137 mmol/L (ref 135–145)

## 2022-06-14 MED ORDER — ENOXAPARIN SODIUM 40 MG/0.4ML IJ SOSY
40.0000 mg | PREFILLED_SYRINGE | INTRAMUSCULAR | Status: DC
  Administered 2022-06-14: 40 mg via SUBCUTANEOUS
  Filled 2022-06-14: qty 0.4

## 2022-06-14 MED ORDER — ACETAMINOPHEN 500 MG PO TABS
1000.0000 mg | ORAL_TABLET | Freq: Four times a day (QID) | ORAL | Status: DC
  Administered 2022-06-14 – 2022-06-17 (×8): 1000 mg via ORAL
  Filled 2022-06-14 (×8): qty 2

## 2022-06-14 MED ORDER — IOHEXOL 350 MG/ML SOLN
75.0000 mL | Freq: Once | INTRAVENOUS | Status: AC | PRN
  Administered 2022-06-14: 75 mL via INTRAVENOUS

## 2022-06-14 MED ORDER — SODIUM CHLORIDE 0.9 % IV SOLN
3.0000 g | Freq: Three times a day (TID) | INTRAVENOUS | Status: DC
  Administered 2022-06-14 – 2022-06-17 (×9): 3 g via INTRAVENOUS
  Filled 2022-06-14 (×9): qty 8

## 2022-06-14 MED ORDER — SODIUM CHLORIDE 0.9 % IV SOLN
3.0000 g | Freq: Once | INTRAVENOUS | Status: AC
  Administered 2022-06-14: 3 g via INTRAVENOUS
  Filled 2022-06-14: qty 8

## 2022-06-14 MED ORDER — POTASSIUM CHLORIDE 10 MEQ/100ML IV SOLN
10.0000 meq | Freq: Once | INTRAVENOUS | Status: AC
  Administered 2022-06-14: 10 meq via INTRAVENOUS
  Filled 2022-06-14: qty 100

## 2022-06-14 MED ORDER — DEXAMETHASONE SODIUM PHOSPHATE 10 MG/ML IJ SOLN
8.0000 mg | Freq: Three times a day (TID) | INTRAMUSCULAR | Status: AC
  Administered 2022-06-14 – 2022-06-15 (×3): 8 mg via INTRAVENOUS
  Filled 2022-06-14 (×3): qty 1

## 2022-06-14 MED ORDER — OXYCODONE-ACETAMINOPHEN 5-325 MG PO TABS
1.0000 | ORAL_TABLET | Freq: Once | ORAL | Status: AC
  Administered 2022-06-14: 1 via ORAL
  Filled 2022-06-14: qty 1

## 2022-06-14 MED ORDER — OXYCODONE HCL 5 MG PO TABS
5.0000 mg | ORAL_TABLET | Freq: Four times a day (QID) | ORAL | Status: DC | PRN
  Administered 2022-06-15 (×2): 5 mg via ORAL
  Filled 2022-06-14 (×2): qty 1

## 2022-06-14 MED ORDER — LIDOCAINE HCL (PF) 1 % IJ SOLN
5.0000 mL | Freq: Once | INTRAMUSCULAR | Status: AC
  Administered 2022-06-14: 5 mL
  Filled 2022-06-14: qty 5

## 2022-06-14 MED ORDER — POTASSIUM CHLORIDE CRYS ER 20 MEQ PO TBCR
40.0000 meq | EXTENDED_RELEASE_TABLET | Freq: Once | ORAL | Status: AC
  Administered 2022-06-14: 40 meq via ORAL
  Filled 2022-06-14: qty 2

## 2022-06-14 NOTE — Consult Note (Addendum)
ENT BRIEF CONSULT NOTE  ID: 27 y/o F with odontogenic abscess a/w left mandibular molar s/p I&D Today by ED Attending Dr. Lavonna Rua. Requesting ENT Evaluation of post I&D CT Scan. No respiratory distress, stridor, or floor of mouth firmness. Has been on Augmentin the last week [facility]10/22.   Results reviewed. CT demonstrates sequelae of left odontogenic abscess I&D with packing material and surrounding cellulitis / fat stranding.   Recommendations: - Admit to hospitalist medicine for IV abx (Unasyn appropriate) - Consider decadron 8 mg Q8 x 3 doses for pain/swelling - ENT to manage neck wound and monitor for need to perform repeat washout in the OR in the next several days pending clinical  course  Electronically signed by:  Scarlette Ar, MD  Staff Physician Facial Plastic & Reconstructive Surgery Otolaryngology - Head and Neck Surgery [facility] [place]   Addendum:  Rounded on patient. Resting comfortably in NAD. Denies pain  On exam: No respiratory distress or stridor.  left neck dressing in place with serosanguinous staining. Left neck moderate edema (also with packing in place from ED Physician). Mouth opening mild limitation. Floor of mouth soft. Tongue protrudes midline.   A/P: PPD#0 I&D Dental abscess by ED. Doing well on ward.   Continue recs as above. Will see patient again tomorrow and withdraw packing  Electronically signed by:  Scarlette Ar, MD  Staff Physician Facial Plastic & Reconstructive Surgery Otolaryngology - Head and Neck Surgery [facility] [place]

## 2022-06-14 NOTE — ED Triage Notes (Signed)
Patient coming to ED for evaluation of swelling to L jaw/face.  Reports she has a known dental infection to molars/wisdom teeth.  Started on antibiotics on Monday.  Swelling and pain has increased.  No reports of fevers.

## 2022-06-14 NOTE — H&P (Signed)
Date: 06/14/2022               Patient Name:  Angela Hammond MRN: [id number]  DOB: 11-Apr-1995 Age / Sex: 27 y.o., female   PCP: Pcp, No         Medical Service: Internal Medicine Teaching Service         Attending Physician: Dr. Mercie Eon, MD    Hammond Contact: Neldon Labella, MD      Pager: Lindley Magnus [phone]      Second Contact: Rudene Christians, DO      Pager: Milinda Pointer [phone]           After Hours (After 5p/  Hammond Contact Pager: [phone]  weekends / holidays): Second Contact Pager: [phone]   SUBJECTIVE   Chief Complaint: Facial swelling  History of Present Illness:  Angela Hammond is a 27 year old female with no pertinent past medical history presenting with facial swelling.  She has had problems with dentition and multiple teeth extractions over the past few months. Had a tooth pulled on the right side at the beginning of March, had a little bit of swelling afterwards and was referred to an oral surgeon during her follow-up with a dentist.  She has not been able to see the oral surgeon yet.  She started having Mouth pain a couple weeks ago towards the left side of her face, face and area under left mandible started swelling up a week ago, was taking ibuprofen for pain but swelling and pain has gotten worse over time. She has a dentist appointment coming up but hasn't seen them after the onset of the symptoms. She was started on augmentin after going to urgent care earlier this week on 5/27, but this does not seem to be helping either. Had some trouble eating/chewing. Had fever and chills last weekend before starting antibiotics. No n/v. Endorses fatigue, thoguht mentions she has been working more. No dyspnea or airway closure. No dysphagia.  No drainage from face/neck mass.  Has had two prior tooth infections and extractions, she is scheduled for wisdom tooth removal in about a month  ED Course: Had I&D done in the ED, started on Unasyn and Decadron after evaluation by  ENT.  Meds:  Current Meds  Medication Sig   amoxicillin-clavulanate (AUGMENTIN) 875-125 MG tablet Take 1 tablet by mouth every 12 (twelve) hours.   ibuprofen (ADVIL) 800 MG tablet Take 1 tablet (800 mg total) by mouth 3 (three) times daily.    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   OBJECTIVE:   Physical Exam: Blood pressure 114/74, pulse 68, temperature 98.1 F (36.7 C),  temperature source Oral, resp. rate 15, height 5\' 3"  (1.6 m), weight 97.5 kg, last menstrual period 05/22/2022, SpO2 99 %, unknown if currently breastfeeding.  Constitutional:  no acute distress HEENT: S/p left mandibular molar extraction, dental caries over remaining left molars.  No erythema, edema or drainage in oropharynx.  Airway is patent.  Firm left submandibular mass covered with gauze s/p I&D with serosanguineous drainage Pulmonary/Chest: normal work of breathing on room air   Labs:    Latest Ref Rng & Units 06/14/2022    9:44 AM 08/31/2021    1:18 PM 07/24/2021   10:48 AM 06/13/2021   10:29 AM 07/08/2020    1:18 PM 03/14/2019   10:16 AM 08/06/2017    9:08 PM  CBC EXTENDED  WBC 4.0 - 10.5 K/uL 7.4   7.1  5.4  5.5  7.4  5.1    RBC 3.87 - 5.11 MIL/uL 3.95  4.53  4.67  4.50  4.50  5.06    Hemoglobin 12.0 - 15.0 g/dL 9.3  96.0  45.4  09.8  11.4  12.9  13.9   HCT 36.0 - 46.0 % 30.1  35.6  36.7  35.6  35.6  41.9  41.0   Platelets 150 - 400 K/uL 246  162  213  212  141  231    NEUT# 1.7 - 7.7 K/uL 4.5   2.9    2.6    Lymph# 0.7 - 4.0 K/uL 1.8   1.8    1.9        Latest Ref Rng & Units 06/14/2022    9:44 AM 03/14/2019   10:16 AM 08/06/2017    9:08 PM 08/06/2017    5:19 PM 10/08/2014   10:37 AM 04/17/2011    4:14 PM  CMP  Glucose 70 - 99 mg/dL 89  95  87  87  94  98   BUN 6 - 20 mg/dL 5  10  11  10  10  7    Creatinine 0.44 - 1.00 mg/dL 1.19  1.47  8.29  5.62  0.93  0.83   Sodium 135 - 145 mmol/L 137  139  143  141  138  136   Potassium 3.5 - 5.1 mmol/L 2.9  4.3  3.6  3.9  4.0  3.8   Chloride 98 - 111 mmol/L 104  105  104  109  105  101   CO2 22 - 32 mmol/L 22  23   26  24  24    Calcium 8.9 - 10.3 mg/dL 8.5  9.3   9.1  8.8  9.2   Total Protein 6.5 - 8.1 g/dL  7.3    7.3  7.4   Total Bilirubin 0.3 - 1.2 mg/dL  0.7    0.7  0.7   Alkaline Phos 38 - 126 U/L  45    62  63   AST 15 - 41 U/L  19    16  15    ALT 0 - 44 U/L  13    11  10      Imaging: CT soft tissue neck with contrast: IMPRESSION: 1. Postprocedural changes from recent incision and drainage of a left submandibular/perimandibular odontogenic soft tissue abscess with packing material in place. There is a small of residual fluid along the posterior aspect of the packing material, which could represent residual or recurrent abscess. 2. Soft tissue stranding involves the subcutaneous soft tissues, the submandibular space and the submental space. Preprocedural imaging is not available  to assess for interval change hazy.    EKG:  ASSESSMENT & PLAN:   Assessment & Plan by Problem: Principal Problem:   Odontogenic infection of jaw   Angela Hammond is a 27 y.o. female with no pertinent PMH who presented with facial swelling and  admitted for left submandibular abscess on hospital day 0  # Left submandibular abscess  Patient presents with left facial swelling with a history of multiple teeth extractions and recent other dental procedures with concern for submandibular odontogenic abscess s/p I&D on 5/30.  CT shows residual postprocedural changes and some residual fluid with packing material in place around the abscess.  ENT is following.  The patient is on Unasyn currently.  Pain is well-controlled.  No concern for airway compromise, dysphagia, or other red flag symptoms. -Continue Unasyn, Decadron - Appreciate ENT assistance, starting dysphagia diet for now, can make n.p.o. at midnight in case procedure tomorrow - Tylenol 1000 mg every 6 hours, oxycodone 5 mg every 6 hours as needed - Trend daily CBC  # Hypokalemia K2.9 at admission.  Repleted in ED. - Follow-up mag, trend BMP and replete potassium as needed  # Microcytic anemia Hemoglobin with drop of about 2 from 11.4 9 months ago.  With microcytic anemia possibly due to blood loss. -CTM, can get iron panel in the outpatient setting.  Diet:  Dysphagia 3 VTE: Enoxaparin IVF: None,None Code: Full  Prior to Admission Living Arrangement: Home, living by herself Anticipated Discharge Location: Home Barriers to Discharge: IV antibiotics and management of abscess  Dispo: Admit patient to inpatient  Signed: Lyndle Herrlich, MD Internal Medicine Resident PGY-1  06/14/2022, 4:25 PM

## 2022-06-14 NOTE — ED Provider Notes (Signed)
[place] EMERGENCY DEPARTMENT AT [facility] Provider Note   CSN: [id number] Arrival date & time: 06/14/22  7829     History  Chief Complaint  Patient presents with  . Oral Swelling  . Facial Swelling    Angela Hammond is a 27 y.o. female.  Patient is a 27 year old female with no significant past medical history presenting to the emergency department with facial swelling.  Patient states that she was seen in urgent care a few days ago and was diagnosed with a dental infection and was started on Augmentin.  She states that she has been taking this as prescribed however the swelling close only increased.  She states that she started to notice some drainage from her face.  She denies any tongue or throat swelling, difficulty swallowing or shortness of breath.  She denies tasting any drainage inside her mouth or dental pain.  Of note she is scheduled to have her wisdom teeth removed in about a month.  The history is provided by the patient.       Home Medications Prior to Admission medications   Medication Sig Start Date End Date Taking? Authorizing Provider  amoxicillin-clavulanate (AUGMENTIN) 875-125 MG tablet Take 1 tablet by mouth every 12 (twelve) hours. 06/10/22  Yes Carlisle Beers, FNP  ibuprofen (ADVIL) 800 MG tablet Take 1 tablet (800 mg total) by mouth 3 (three) times daily. 06/10/22  Yes Carlisle Beers, FNP  dicyclomine (BENTYL) 20 MG tablet Take 1 tablet (20 mg total) by mouth 2 (two) times daily as needed for spasms. 03/17/18 01/27/19  Caccavale, Sophia, PA-C  fluticasone (FLONASE) 50 MCG/ACT nasal spray Place 1 spray into both nostrils daily. 10/12/17 01/27/19  Emi Holes, PA-C      Allergies    Patient has no known allergies.    Review of Systems   Review of Systems  Physical Exam Updated Vital Signs BP 114/74   Pulse 68   Temp 98.1 F (36.7 C) (Oral)   Resp 15   Ht 5\' 3"  (1.6 m)   Wt 97.5 kg   LMP 05/22/2022   SpO2 99%   BMI  38.09 kg/m  Physical Exam Vitals and nursing note reviewed.  Constitutional:      General: She is not in acute distress.    Appearance: Normal appearance.  HENT:     Head: Normocephalic and atraumatic.     Nose: Nose normal.     Mouth/Throat:     Mouth: Mucous membranes are moist.     Pharynx: Oropharynx is clear.     Comments: No dental tenderness to palpation, no swelling beneath the tongue, no swelling to posterior oropharynx, no trismus, tolerating secretions, normal phonation  Significant swelling with palpable fluctuance to left lateral jaw with surrounding erythema, warmth and induration Eyes:     Extraocular Movements: Extraocular movements intact.     Conjunctiva/sclera: Conjunctivae normal.  Neurological:     Mental Status: She is alert.     ED Results / Procedures / Treatments   Labs (all labs ordered are listed, but only abnormal results are displayed) Labs Reviewed  BASIC METABOLIC PANEL - Abnormal; Notable for the following components:      Result Value   Potassium 2.9 (*)    BUN 5 (*)    Calcium 8.5 (*)    All other components within normal limits  CBC WITH DIFFERENTIAL/PLATELET - Abnormal; Notable for the following components:   Hemoglobin 9.3 (*)    HCT 30.1 (*)  MCV 76.2 (*)    MCH 23.5 (*)    RDW 16.9 (*)    All other components within normal limits  I-STAT BETA HCG BLOOD, ED ([facility], [facility], [facility] ONLY)    EKG None  Radiology No results found.  Procedures .Marland KitchenIncision and Drainage  Date/Time: 06/14/2022 9:59 AM  Performed by: Rexford Maus, DO Authorized by: Rexford Maus, DO   Consent:    Consent obtained:  Verbal   Consent given by:  Patient   Risks, benefits, and alternatives were discussed: yes     Risks discussed:  Bleeding, damage to other organs, incomplete drainage, infection and pain   Alternatives discussed:  No treatment and delayed treatment Universal protocol:    Procedure explained and questions answered to patient or  proxy's satisfaction: yes     Patient identity confirmed:  Verbally with patient Location:    Type:  Abscess   Size:  8x6 cm   Location:  Head   Head location:  Face Pre-procedure details:    Skin preparation:  Chlorhexidine with alcohol Sedation:    Sedation type:  None Anesthesia:    Anesthesia method:  Local infiltration   Local anesthetic:  Lidocaine 1% w/o epi Procedure type:    Complexity:  Complex Procedure details:    Ultrasound guidance: yes     Needle aspiration: yes     Needle size:  18 G   Incision types:  Stab incision   Wound management:  Probed and deloculated   Drainage:  Purulent   Drainage amount:  Copious   Wound treatment:  Wound left open   Packing materials:  1/4 in iodoform gauze   Amount 1/4" iodoform:  ~40 cm Post-procedure details:    Procedure completion:  Tolerated well, no immediate complications Ultrasound ED Soft Tissue  Date/Time: 06/14/2022 10:04 AM  Performed by: Rexford Maus, DO Authorized by: Rexford Maus, DO   Procedure details:    Indications: localization of abscess     Transverse view:  Visualized   Longitudinal view:  Visualized   Images: not archived   Location:    Location: face     Side:  Left Findings:     abscess present     Medications Ordered in ED Medications  dexamethasone (DECADRON) injection 8 mg (has no administration in time range)  oxyCODONE-acetaminophen (PERCOCET/ROXICET) 5-325 MG per tablet 1 tablet (1 tablet Oral Given 06/14/22 0730)  lidocaine (PF) (XYLOCAINE) 1 % injection 5 mL (5 mLs Infiltration Given by Other 06/14/22 0920)  Ampicillin-Sulbactam (UNASYN) 3 g in sodium chloride 0.9 % 100 mL IVPB (0 g Intravenous Stopped 06/14/22 1031)  potassium chloride SA (KLOR-CON M) CR tablet 40 mEq (40 mEq Oral Given 06/14/22 1136)  iohexol (OMNIPAQUE) 350 MG/ML injection 75 mL (75 mLs Intravenous Contrast Given 06/14/22 1108)    ED Course/ Medical Decision Making/ A&P Clinical Course as of  06/14/22 1341  Thu Jun 14, 2022  0913 I&D performed with significant purulent drainage and packing was placed. She still has significant swelling despite drainage and will have CT neck to evaluate for deeper infection. [VK]  1051 Labs with hypokalemia which will be repleted, mildly worsening anemia from baseline. [VK]  1156 Lucency at periapical space of tooth concerning for periapical abscess with submandibular swelling. No signs of airway compromise. Unclear if swelling may be secondary to ludwig's and will consult ENT for recommendations for IV abx vs outpatient follow up. [VK]  1248 I spoke with Dr. Ernestene Kiel of ENT who  recommended 24-48 hrs of IV abx and decadron 8 mg q8 hrs x 3 doses. He will continue to follow. She will be admitted to medicine. [VK]  1340 I spoke with internal medicine who plans to admit the patient. [VK]    Clinical Course User Index [VK] Rexford Maus, DO                             Medical Decision Making This patient presents to the ED with chief complaint(s) of facial swelling with no pertinent past medical history which further complicates the presenting complaint. The complaint involves an extensive differential diagnosis and also carries with it a high risk of complications and morbidity.    The differential diagnosis includes abscess, cellulitis, no signs of sepsis on exam, no dental tenderness or intraoral swelling making a dental infection/dental abscess or Ludwick's less likely, no swelling to the posterior oropharynx making PTA or RPA unlikely  Additional history obtained: Additional history obtained from N/A Records reviewed outpatient urgent care records  ED Course and Reassessment: On patient's arrival she is hemodynamically stable in no acute distress.  She does have significant swelling with palpable fluctuance to her left jaw.  Bedside ultrasound was performed that did show a large abscess and plan is for I&D.  Patient does have significant  surrounding induration and erythema and will likely need to be continued on antibiotics.  Independent labs interpretation:  The following labs were independently interpreted: hypokalemia, mildly worsening anemia  Independent visualization of imaging: - I independently visualized the following imaging with scope of interpretation limited to determining acute life threatening conditions related to emergency care: CT soft tissue neck, which revealed abscess s/p I&D with surrounding cellulitis, evidence of periapical abscess  Consultation: - Consulted or discussed management/test interpretation w/ external professional: ENT, hospitalist  Consideration for admission or further workup: patient requires admission for IV antibiotics and monitoring of facial infection Social Determinants of health: N/A    Amount and/or Complexity of Data Reviewed Labs: ordered. Radiology: ordered.  Risk Prescription drug management. Decision regarding hospitalization.          Final Clinical Impression(s) / ED Diagnoses Final diagnoses:  Dental abscess  Facial abscess    Rx / DC Orders ED Discharge Orders     None         Rexford Maus, DO 06/14/22 1251

## 2022-06-14 NOTE — Progress Notes (Signed)
Patient transferred from ED around 1640 PM. Alert and oriented

## 2022-06-14 NOTE — ED Notes (Signed)
I&D tray at bedside.

## 2022-06-14 NOTE — ED Notes (Signed)
Patient transported to CT 

## 2022-06-15 DIAGNOSIS — K047 Periapical abscess without sinus: Principal | ICD-10-CM

## 2022-06-15 DIAGNOSIS — K122 Cellulitis and abscess of mouth: Secondary | ICD-10-CM

## 2022-06-15 DIAGNOSIS — E876 Hypokalemia: Secondary | ICD-10-CM

## 2022-06-15 DIAGNOSIS — D539 Nutritional anemia, unspecified: Secondary | ICD-10-CM

## 2022-06-15 HISTORY — DX: Periapical abscess without sinus: K04.7

## 2022-06-15 LAB — BASIC METABOLIC PANEL
Anion gap: 11 (ref 5–15)
BUN: 6 mg/dL (ref 6–20)
CO2: 21 mmol/L — ABNORMAL LOW (ref 22–32)
Calcium: 9 mg/dL (ref 8.9–10.3)
Chloride: 105 mmol/L (ref 98–111)
Creatinine, Ser: 0.62 mg/dL (ref 0.44–1.00)
GFR, Estimated: >60 mL/min (ref >60)
Glucose, Bld: 123 mg/dL — ABNORMAL HIGH (ref 70–99)
Potassium: 4.1 mmol/L (ref 3.5–5.1)
Sodium: 137 mmol/L (ref 135–145)

## 2022-06-15 LAB — CBC
HCT: 33.4 % — ABNORMAL LOW (ref 36.0–46.0)
Hemoglobin: 10.5 g/dL — ABNORMAL LOW (ref 12.0–15.0)
MCH: 23.6 pg — ABNORMAL LOW (ref 26.0–34.0)
MCHC: 31.4 g/dL (ref 30.0–36.0)
MCV: 75.2 fL — ABNORMAL LOW (ref 80.0–100.0)
Platelets: 273 10*3/uL (ref 150–400)
RBC: 4.44 MIL/uL (ref 3.87–5.11)
RDW: 17 % — ABNORMAL HIGH (ref 11.5–15.5)
WBC: 8 10*3/uL (ref 4.0–10.5)
nRBC: 0 % (ref 0.0–0.2)

## 2022-06-15 MED ORDER — VANCOMYCIN HCL 1500 MG/300ML IV SOLN
1500.0000 mg | Freq: Once | INTRAVENOUS | Status: AC
  Administered 2022-06-15: 1500 mg via INTRAVENOUS
  Filled 2022-06-15: qty 300

## 2022-06-15 MED ORDER — FENTANYL CITRATE PF 50 MCG/ML IJ SOSY: 50.0000 ug | PREFILLED_SYRINGE | Freq: Once | INTRAMUSCULAR | Status: AC

## 2022-06-15 MED ORDER — HYDROMORPHONE HCL 1 MG/ML IJ SOLN: 0.5000 mg | Freq: Four times a day (QID) | INTRAMUSCULAR | Status: DC | PRN

## 2022-06-15 MED ORDER — VANCOMYCIN HCL IN DEXTROSE 1-5 GM/200ML-% IV SOLN: 1000.0000 mg | Freq: Once | INTRAVENOUS | Status: DC

## 2022-06-15 MED ORDER — VANCOMYCIN HCL IN DEXTROSE 1-5 GM/200ML-% IV SOLN
1000.0000 mg | Freq: Two times a day (BID) | INTRAVENOUS | Status: DC
  Administered 2022-06-15 – 2022-06-17 (×4): 1000 mg via INTRAVENOUS
  Filled 2022-06-15 (×4): qty 200

## 2022-06-15 MED ORDER — FENTANYL CITRATE PF 50 MCG/ML IJ SOSY
PREFILLED_SYRINGE | INTRAMUSCULAR | Status: AC
  Administered 2022-06-15: 50 ug via INTRAVENOUS
  Filled 2022-06-15: qty 1

## 2022-06-15 NOTE — Progress Notes (Signed)
ENT PROGRESS NOTE  Evaluated patient this AM. Dressing removed. Frank pus draining from neck wound, 1cm incision created by ED team. All gauze removed. Neck copiously irrigated with 250 mL saline until clear. Gauze placed in wound, dressing applied.  I recommend consider broadening abx (add vanc), and make NPO after midnight. If wound looks worse tomorrow will recommend OR For washout.  I have personally spent 25 minutes involved in face-to-face and non-face-to-face activities for this patient on the day of the visit.  Professional time spent includes the following activities, in addition to those noted in the documentation: preparing to see the patient (eg, review of tests), obtaining and/or reviewing separately obtained history, performing a medically appropriate examination and/or evaluation, counseling and educating the patient/family/caregiver, ordering medications, tests or procedures, referring and communicating with other healthcare professionals, documenting clinical information in the electronic or other health record, independently interpreting results and communicating results with the patient/family/caregiver, care coordination.  Electronically signed by:  Scarlette Ar, MD  Staff Physician Facial Plastic & Reconstructive Surgery Otolaryngology - Head and Neck Surgery [facility] [place]

## 2022-06-15 NOTE — Plan of Care (Signed)

## 2022-06-15 NOTE — Progress Notes (Signed)
**Note De-Identified Angela Obfuscation** Subjective:   Summary: Angela Hammond is a 27 y.o. year old female currently admitted on the IMTS HD#1 for submandibular abscess.  Overnight Events: NAEON, HD stable, oxygenating 100% on room air.  Received one-time oxycodone 5 for severe pain overnight.  Feeling frustrated this morning.  Said that she had some pain during the bedside washout earlier today.  No dysphagia, no dyspnea or feeling of airway closing up.  No worsening swelling.  She was able to eat dysphagia diet yesterday without any issues.  Did have some purulent discharge from her neck mass earlier today.  Discussed plan for today and n.p.o. at midnight for OR washout tomorrow.  She is amenable to plan.  Objective:  Vital signs in last 24 hours: Vitals:   06/14/22 1506 06/14/22 1643 06/14/22 2031 06/15/22 0534  BP:  (!) 125/92 114/76 (!) 140/80  Pulse:  76  (!) 59  Resp:  16 16 18   Temp: 98.2 F (36.8 C) 98.1 F (36.7 C) 98.9 F (37.2 C) 98.4 F (36.9 C)  TempSrc: Oral  Oral   SpO2:  100% 100% 100%  Weight:      Height:       Supplemental O2: Room Air SpO2: 100 %   Physical Exam:  Constitutional:  no acute distress HEENT: S/p left mandibular molar extraction, dental caries over remaining left molars.  No erythema, edema or drainage in oropharynx.  Airway is patent.  Firm left submandibular mass covered with gauze s/p I&D with serosanguineous and purulent drainage Pulmonary/Chest: normal work of breathing on room air   [employer]   06/14/22 0627  Weight: 97.5 kg     Intake/Output Summary (Last 24 hours) at 06/15/2022 0731 Last data filed at 06/15/2022 0406 Gross per 24 hour  Intake 440 ml  Output --  Net 440 ml   Net IO Since Admission: 440 mL [06/15/22 0731]  Pertinent Labs:    Latest Ref Rng & Units 06/15/2022    6:04 AM 06/14/2022    9:44 AM 08/31/2021    1:18 PM  CBC  WBC 4.0 - 10.5 K/uL 8.0  7.4  7.1   Hemoglobin 12.0 - 15.0 g/dL 16.1  9.3  09.6   Hematocrit 36.0 -  46.0 % 33.4  30.1  35.6   Platelets 150 - 400 K/uL 273  246  162        Latest Ref Rng & Units 06/15/2022    6:04 AM 06/14/2022    9:44 AM 03/14/2019   10:16 AM  CMP  Glucose 70 - 99 mg/dL 045  89  95   BUN 6 - 20 mg/dL 6  5  10    Creatinine 0.44 - 1.00 mg/dL 4.09  8.11  9.14   Sodium 135 - 145 mmol/L 137  137  139   Potassium 3.5 - 5.1 mmol/L 4.1  2.9  4.3   Chloride 98 - 111 mmol/L 105  104  105   CO2 22 - 32 mmol/L 21  22  23    Calcium 8.9 - 10.3 mg/dL 9.0  8.5  9.3   Total Protein 6.5 - 8.1 g/dL   7.3   Total Bilirubin 0.3 - 1.2 mg/dL   0.7   Alkaline Phos 38 - 126 U/L   45   AST 15 - 41 U/L   19   ALT 0 - 44 U/L   13  Imaging: CT Soft Tissue Neck W Contrast  Result Date: 06/14/2022 CLINICAL DATA:  Soft tissue infection suspected, neck, xray done signficant facial swelling s/p IANDD EXAM: CT NECK WITH CONTRAST TECHNIQUE: Multidetector CT imaging of the neck was performed using the standard protocol following the bolus administration of intravenous contrast. RADIATION DOSE REDUCTION: This exam was performed according to the departmental dose-optimization program which includes automated exposure control, adjustment of the mA and/or kV according to patient size and/or use of iterative reconstruction technique. CONTRAST:  75mL OMNIPAQUE IOHEXOL 350 MG/ML SOLN COMPARISON:  None Available. FINDINGS: Pharynx and larynx: Bilateral tonsils are normal in appearance. The epiglottis is normal in appearance. No evidence of retropharyngeal edema. Salivary glands: Bilateral parotid glands are normal in appearance. There is soft tissue stranding in the left submandibular/perimandibular region abuts the left submandibular gland (series 6, image 67). Thyroid: Normal. Lymph nodes: Prominent level 1A lymph nodes measuring up to 7 mm, favored to be reactive. Vascular: Negative. Limited intracranial: Negative. Visualized orbits: Negative. Mastoids and visualized paranasal sinuses: Clear. Skeleton: No  acute or aggressive process. Upper chest: Negative. Other: Postprocedural changes from recent incision and drainage a left submandibular/perimandibular odontogenic soft tissue abscess (note pre-procedural imaging is not available for comparison). There is a large periapical lucency at the base of the left mandibular molar (series 4, image 64) with cortical breakthrough. This is immediately adjacent to the region of soft tissue stranding. There is hyperdense curvilinear packing material and air in the region of soft tissue stranding with a small of residual fluid along the posterior aspect of the packing material (series 6, image 68). Soft tissue stranding involves the subcutaneous soft tissues, the submandibular space and the submental space (series 5, image 43). IMPRESSION: 1. Postprocedural changes from recent incision and drainage of a left submandibular/perimandibular odontogenic soft tissue abscess with packing material in place. There is a small of residual fluid along the posterior aspect of the packing material, which could represent residual or recurrent abscess. 2. Soft tissue stranding involves the subcutaneous soft tissues, the submandibular space and the submental space. Preprocedural imaging is not available to assess for interval change hazy. Electronically Signed   By: Lorenza Cambridge M.D.   On: 06/14/2022 11:25     EKG: My EKG interpretation is as follows:   Assessment/Plan:   Principal Problem:   Odontogenic infection of jaw   Patient Summary: Angela Hammond is a 27 y.o. female with no pertinent PMH who presented with facial swelling and admitted for left submandibular abscess     # Left submandibular abscess  Patient presents with left facial swelling with a history of multiple teeth extractions and recent other dental procedures with concern for submandibular odontogenic abscess s/p I&D on 5/30.  CT shows residual postprocedural changes and some residual fluid with packing  material in place around the abscess.  ENT performed repeat bedside washout this morning and noted purulent drainage..  The patient is on Unasyn currently, added on Vanc due to concern for purulent soft tissue infection.  Received 3 doses of Decadron.  Pain is well-controlled.  No concern for airway compromise, dysphagia, or other red flag symptoms. -Continue Unasyn, vancomycin - Appreciate ENT assistance, starting dysphagia diet for now, n.p.o. at midnight for OR washout tomorrow - Tylenol 1000 mg every 6 hours, oxycodone 5 mg every 6 hours as needed, Dilaudid 0.5 every 6 as needed for breakthrough - Trend daily CBC   # Hypokalemia Repleted, CTM   # Microcytic anemia Hemoglobin with drop of  about 2 from 11.4 9 months ago.  With microcytic anemia possibly due to blood loss. -CTM, can get iron panel in the outpatient setting.  Diet:  Dysphagia 3 IVF: None,None VTE: Enoxaparin, holding as 24 hours before procedure Code: Full PT/OT recs: Pending, none. TOC recs: None Family Update:   Dispo: Anticipated discharge to Home pending IV antibiotics and  ENT management  Lyndle Herrlich, MD PGY-1 Internal Medicine Resident Please contact the on call pager after 5 pm and on weekends at [phone].

## 2022-06-15 NOTE — Progress Notes (Signed)
Pharmacy Antibiotic Note  Angela Hammond is a 27 y.o. female admitted on 06/14/2022 with L submandibular abscess with frank pus s/p I&D. Patient with multiple teeth extractions over the last months. Failed outpatient amoxicillin/clavulaunate.  Pharmacy has been consulted for vancomycin dosing. Patient is on ampicillin/sulbactam.  5/31 Vancomycin 1000mg  Q 12 hr Scr used: 0.8 mg/dL Weight: 16.1 kg Vd coeff: 0.5 L/kg Est AUC: 538  Plan: Vancomycin 1500mg  x1 then 1000mg  q12hr ampicillin/sulbactam per team Monitor cultures, clinical status, renal function, vancomycin level Narrow abx as able and f/u duration    Height: 5\' 3"  (160 cm) Weight: 97.5 kg (215 lb) IBW/kg (Calculated) : 52.4  Temp (24hrs), Avg:98.3 F (36.8 C), Min:97.9 F (36.6 C), Max:98.9 F (37.2 C)  Recent Labs  Lab 06/14/22 0944 06/15/22 0604  WBC 7.4 8.0  CREATININE 0.77 0.62    Estimated Creatinine Clearance: 116.4 mL/min (by C-G formula based on SCr of 0.62 mg/dL).    No Known Allergies  Antimicrobials this admission: ampsulb 5/30 >>  vanc 5/31 >>   Dose adjustments this admission: N/a  Microbiology results:  None   Thank you for allowing pharmacy to be a part of this patient's care.  Alphia Moh, PharmD, BCPS, BCCP Clinical Pharmacist  Please check AMION for all [facility] Pharmacy phone numbers After 10:00 PM, call Main Pharmacy [phone]

## 2022-06-15 NOTE — Progress Notes (Addendum)
  Transition of Care [facility]) Screening Note   Patient Details  Name: Angela Hammond Date of Birth: 11-07-1995   Transition of Care [facility]) CM/SW Contact:    Janae Bridgeman, RN Phone Number: 06/15/2022, 3:51 PM  I met with the patient at the bedside - no needs at this time.  Patient does not have a PCP - patient was agreeable to follow up with [facility] Internal Medicine group - reminder placed in AVS for patient to schedule an appointment.  I attempted by the office was closed today after 12 noon.  Transition of Care Department [facility]) has reviewed patient and no TOC needs have been identified at this time. We will continue to monitor patient advancement through interdisciplinary progression rounds. If new patient transition needs arise, please place a TOC consult.

## 2022-06-16 DIAGNOSIS — M272 Inflammatory conditions of jaws: Secondary | ICD-10-CM

## 2022-06-16 LAB — BASIC METABOLIC PANEL
Anion gap: 7 (ref 5–15)
BUN: 10 mg/dL (ref 6–20)
CO2: 24 mmol/L (ref 22–32)
Calcium: 8.6 mg/dL — ABNORMAL LOW (ref 8.9–10.3)
Chloride: 105 mmol/L (ref 98–111)
Creatinine, Ser: 0.7 mg/dL (ref 0.44–1.00)
GFR, Estimated: >60 mL/min (ref >60)
Glucose, Bld: 111 mg/dL — ABNORMAL HIGH (ref 70–99)
Potassium: 3.9 mmol/L (ref 3.5–5.1)
Sodium: 136 mmol/L (ref 135–145)

## 2022-06-16 LAB — CBC
HCT: 33.4 % — ABNORMAL LOW (ref 36.0–46.0)
Hemoglobin: 10.4 g/dL — ABNORMAL LOW (ref 12.0–15.0)
MCH: 23.4 pg — ABNORMAL LOW (ref 26.0–34.0)
MCHC: 31.1 g/dL (ref 30.0–36.0)
MCV: 75.2 fL — ABNORMAL LOW (ref 80.0–100.0)
Platelets: 274 10*3/uL (ref 150–400)
RBC: 4.44 MIL/uL (ref 3.87–5.11)
RDW: 17.1 % — ABNORMAL HIGH (ref 11.5–15.5)
WBC: 11.5 10*3/uL — ABNORMAL HIGH (ref 4.0–10.5)
nRBC: 0 % (ref 0.0–0.2)

## 2022-06-16 MED ORDER — ENOXAPARIN SODIUM 40 MG/0.4ML IJ SOSY
40.0000 mg | PREFILLED_SYRINGE | INTRAMUSCULAR | Status: DC
  Administered 2022-06-16: 40 mg via SUBCUTANEOUS
  Filled 2022-06-16: qty 0.4

## 2022-06-16 MED ORDER — FENTANYL CITRATE PF 50 MCG/ML IJ SOSY
50.0000 ug | PREFILLED_SYRINGE | Freq: Once | INTRAMUSCULAR | Status: AC
  Administered 2022-06-16: 50 ug via INTRAVENOUS
  Filled 2022-06-16: qty 1

## 2022-06-16 NOTE — Progress Notes (Signed)
HD#2 Subjective:  Overnight Events: afebrile, took 2 doses of oxycodone yesterday.  She reports that pain is well-controlled with pain medications. She feels like swelling in her face is improving. She did ok with bedside washout this morning with ENT. She hopes to be home by Monday to get back to work.  Pt is updated on the plan for today, and all questions and concerns are addressed.   Objective:  Vital signs in last 24 hours: Vitals:   06/15/22 1611 06/15/22 1949 06/15/22 1949 06/16/22 0401  BP: 108/66 113/69 113/69 121/76  Pulse: 70 67 67 (!) 57  Resp: 18 18 18 18   Temp: 98.1 F (36.7 C) (!) 97.5 F (36.4 C) (!) 97.5 F (36.4 C) 98.3 F (36.8 C)  TempSrc: Oral     SpO2: 100% 100% 100% 100%  Weight:      Height:       Supplemental O2: Room Air SpO2: 100 %   Physical Exam:  Constitutional: well-appearing Cardiovascular: regular rate and rhythm, no m/r/g HEENT: no swelling, erythema or purulence on inside of mouth, left mandibular remains swollen with serosanguinous drainage with gauze packing in place, no erythema Neurological: alert and oriented x3 Skin: warm and dry Psych: normal mood and affect  [employer]   06/14/22 0627  Weight: 97.5 kg     Intake/Output Summary (Last 24 hours) at 06/16/2022 0634 Last data filed at 06/16/2022 0429 Gross per 24 hour  Intake 861.67 ml  Output --  Net 861.67 ml   Net IO Since Admission: 1,301.67 mL [06/16/22 0634]  Pertinent Labs:    Latest Ref Rng & Units 06/16/2022    3:18 AM 06/15/2022    6:04 AM 06/14/2022    9:44 AM  CBC  WBC 4.0 - 10.5 K/uL 11.5  8.0  7.4   Hemoglobin 12.0 - 15.0 g/dL 16.1  09.6  9.3   Hematocrit 36.0 - 46.0 % 33.4  33.4  30.1   Platelets 150 - 400 K/uL 274  273  246        Latest Ref Rng & Units 06/16/2022    3:18 AM 06/15/2022    6:04 AM 06/14/2022    9:44 AM  CMP  Glucose 70 - 99 mg/dL 045  409  89   BUN 6 - 20 mg/dL 10  6  5    Creatinine 0.44 - 1.00 mg/dL 8.11  9.14  7.82   Sodium  135 - 145 mmol/L 136  137  137   Potassium 3.5 - 5.1 mmol/L 3.9  4.1  2.9   Chloride 98 - 111 mmol/L 105  105  104   CO2 22 - 32 mmol/L 24  21  22    Calcium 8.9 - 10.3 mg/dL 8.6  9.0  8.5     Imaging: No results found.  Assessment/Plan:   Principal Problem:   Odontogenic infection of jaw Active Problems:   Dental abscess   Patient Summary: Angela Hammond is a 27 y.o. with no pertinent PMH, who presented with left mandibular abscess on HD#2.   Left submandibular abscess Leukocytosis Swelling and discharge improving on exam by ENT this morning, he did not think she required OR today. Her vitals remain stable. WBC increased to 11.5 from 8. Infections is being treated with unasyn and vancomycin in setting of purulence from wound 5/31. She received 3 doses of decadron. Pain is well managed. No concern for airway compromise or dysphagia. -Appreciate assistance form ENT -Continue Unasyn day -continue Vancomycin  day -wound cultures pending - Tylenol 1000 mg every 6 hours, oxycodone 5 mg every 6 hours as needed, Dilaudid 0.5 every 6 as needed for breakthrough - Trend daily CBC   Microcytic anemia Hgb baseline 10-11 with MVC 75.2. Hgb at 10.4 this morning. -CTM, can get iron panel in the outpatient setting   Diet: Normal VTE: Enoxaparin Code: Full   Dispo: Anticipated discharge to Home in 2 days pending improvement in abscess.   Angela Hammond, D.O.  Internal Medicine Resident, PGY-2 [facility]  Pager: [phone] 6:34 AM, 06/16/2022   **Please contact the on call pager after 5 pm and on weekends at [phone].**

## 2022-06-16 NOTE — Progress Notes (Signed)
OTOLARYNGOLOGY - HEAD AND NECK SURGERY FACIAL PLASTIC & RECONSTRUCTIVE SURGERY PROGRESS NOTE  ID: 27 y/o F with left neck odontogenic abscess PPD#2 s/p I&D (by ED)  Subjective: NAEON Added vancomycin yesterday to unasyn Afebrile Pain well controlled. No respiratory symptoms   Objective: Vital signs in last 24 hours: Temp:  [97.5 F (36.4 C)-98.3 F (36.8 C)] 97.9 F (36.6 C) (06/01 0804) Pulse Rate:  [57-70] 67 (06/01 0804) Resp:  [16-18] 16 (06/01 0804) BP: (108-136)/(66-94) 136/94 (06/01 0804) SpO2:  [100 %] 100 % (06/01 0804)  Physical exam: General: resting comfortably in NAD OC/OP: MMM, no bleeding. Floor of mouth soft. Normal mouth opening. Neck: Left neck IMPROVEMENT in induration/erythema and tenderness. Fibrinous exudates today, no pus. Wound washed out with 120 mL of sterile saline, packed with gauze.    @LABLAST2 (wbc:2,hgb:2,hct:2,plt:2) Recent Labs    06/15/22 0604 06/16/22 0318  NA 137 136  K 4.1 3.9  CL 105 105  CO2 21* 24  GLUCOSE 123* 111*  BUN 6 10  CREATININE 0.62 0.70  CALCIUM 9.0 8.6*    Medications: I have reviewed the patient's current medications.  Assessment/Plan: PPD#2 I&D Left neck abscess by ED, undergoing daily bedside irrigations. Doing well today  Continue IV Abx Wound culture sent Anticipate hospital care 1-2 more days for wound packing OK to eat (Diet ordered per RN request)  Dispo: Medicine ward   LOS: 2 days   Scarlette Ar 06/16/2022, 8:51 AM  I have personally spent 21 minutes involved in face-to-face and non-face-to-face activities for this patient on the day of the visit.  Professional time spent includes the following activities, in addition to those noted in the documentation: preparing to see the patient (eg, review of tests), obtaining and/or reviewing separately obtained history, performing a medically appropriate examination and/or evaluation, counseling and educating the patient/family/caregiver, ordering  medications, tests or procedures, referring and communicating with other healthcare professionals, documenting clinical information in the electronic or other health record, independently interpreting results and communicating results with the patient/family/caregiver, care coordination.  Electronically signed by:  Scarlette Ar, MD  Staff Physician Facial Plastic & Reconstructive Surgery Otolaryngology - Head and Neck Surgery [facility] [place]

## 2022-06-17 LAB — CBC
HCT: 31.1 % — ABNORMAL LOW (ref 36.0–46.0)
Hemoglobin: 9.7 g/dL — ABNORMAL LOW (ref 12.0–15.0)
MCH: 24 pg — ABNORMAL LOW (ref 26.0–34.0)
MCHC: 31.2 g/dL (ref 30.0–36.0)
MCV: 77 fL — ABNORMAL LOW (ref 80.0–100.0)
Platelets: 270 10*3/uL (ref 150–400)
RBC: 4.04 MIL/uL (ref 3.87–5.11)
RDW: 17.3 % — ABNORMAL HIGH (ref 11.5–15.5)
WBC: 8.4 10*3/uL (ref 4.0–10.5)
nRBC: 0 % (ref 0.0–0.2)

## 2022-06-17 LAB — AEROBIC/ANAEROBIC CULTURE W GRAM STAIN (SURGICAL/DEEP WOUND)

## 2022-06-17 MED ORDER — DOXYCYCLINE MONOHYDRATE 100 MG PO CAPS: 100.0000 mg | ORAL_CAPSULE | Freq: Two times a day (BID) | ORAL | 0 refills | Status: DC

## 2022-06-17 MED ORDER — ACETAMINOPHEN 500 MG PO TABS: 1000.0000 mg | ORAL_TABLET | Freq: Four times a day (QID) | ORAL | 0 refills | Status: DC

## 2022-06-17 MED ORDER — IBUPROFEN 800 MG PO TABS: 800.0000 mg | ORAL_TABLET | Freq: Three times a day (TID) | ORAL | 0 refills | Status: DC | PRN

## 2022-06-17 MED ORDER — AMOXICILLIN-POT CLAVULANATE 875-125 MG PO TABS: 1.0000 | ORAL_TABLET | Freq: Two times a day (BID) | ORAL | 0 refills | Status: AC

## 2022-06-17 NOTE — Progress Notes (Signed)
OTOLARYNGOLOGY - HEAD AND NECK SURGERY FACIAL PLASTIC & RECONSTRUCTIVE SURGERY PROGRESS NOTE  ID: 27 y/o F with left neck odontogenic abscess PPD#3 s/p I&D (by ED)  Subjective: NAEON Doing well Cultures NGTD  Objective: Vital signs in last 24 hours: Temp:  [98.2 F (36.8 C)-98.4 F (36.9 C)] 98.3 F (36.8 C) (06/02 0751) Pulse Rate:  [57-76] 57 (06/02 0751) Resp:  [17-20] 17 (06/02 0751) BP: (98-112)/(49-66) 106/66 (06/02 0751) SpO2:  [98 %-100 %] 98 % (06/02 0751)  Physical exam: General: resting comfortably in NAD OC/OP: MMM, no bleeding. Floor of mouth soft. Normal mouth opening. Neck: Left neck IMPROVEMENT in induration/erythema and tenderness. Fibrinous exudates today, no pus. Wet-to-dry gauze packing placed.    @LABLAST2 (wbc:2,hgb:2,hct:2,plt:2) Recent Labs    06/15/22 0604 06/16/22 0318  NA 137 136  K 4.1 3.9  CL 105 105  CO2 21* 24  GLUCOSE 123* 111*  BUN 6 10  CREATININE 0.62 0.70  CALCIUM 9.0 8.6*     Medications: I have reviewed the patient's current medications.  Assessment/Plan: PPD#3 I&D Left neck abscess by ED, undergoing daily bedside irrigations. Doing well today  Transition to oral abx  Wound culture sent ENT Clinic referral - plan for dressing change on Tuesday 06/19/22  Dispo: Medicine ward   LOS: 3 days   Scarlette Ar 06/17/2022, 9:34 AM  I have personally spent 21 minutes involved in face-to-face and non-face-to-face activities for this patient on the day of the visit.  Professional time spent includes the following activities, in addition to those noted in the documentation: preparing to see the patient (eg, review of tests), obtaining and/or reviewing separately obtained history, performing a medically appropriate examination and/or evaluation, counseling and educating the patient/family/caregiver, ordering medications, tests or procedures, referring and communicating with other healthcare professionals, documenting clinical  information in the electronic or other health record, independently interpreting results and communicating results with the patient/family/caregiver, care coordination.  Electronically signed by:  Scarlette Ar, MD  Staff Physician Facial Plastic & Reconstructive Surgery Otolaryngology - Head and Neck Surgery [facility] [place]

## 2022-06-17 NOTE — Plan of Care (Signed)

## 2022-06-17 NOTE — Hospital Course (Signed)
Left submandibular abscess Leukocytosis Patient presents with left facial swelling with a history of multiple teeth extractions and recent other dental procedures. She had I&D in ED. ENT evaluated her and she was started on Unasyn and given 3 doses of steroids. CT showed residual postprocedural changes and some residual fluid with packing material in place around abscess. ENT did daily washout for 2 days. Her swelling improved and she was discharged with augmentin and doxycycline to complete 10 day course of antibiotics. She will follow-up with ENT 6/4 to change packing.  Hypokalemia K2.9 at admission. Repleted in ED.   Microcytic anemia Hemoglobin with drop of about 2 from 11.4 9 months ago. With microcytic anemia possibly due to blood loss. Plan to check iron studies as outpatient and replete as indicate once infection clears.

## 2022-06-17 NOTE — Discharge Summary (Signed)
Name: Angela Hammond MRN: [id number] DOB: 1996/01/09 27 y.o. PCP: Pcp, No  Date of Admission: 06/14/2022  6:15 AM Date of Discharge: 06/17/22 Attending Physician: Dr. Criselda Peaches  Discharge Diagnosis: Principal Problem:   Odontogenic infection of jaw Active Problems:   Dental abscess    Discharge Medications: Allergies as of 06/17/2022   No Known Allergies      Medication List     TAKE these medications    acetaminophen 500 MG tablet Commonly known as: TYLENOL Take 2 tablets (1,000 mg total) by mouth every 6 (six) hours.   amoxicillin-clavulanate 875-125 MG tablet Commonly known as: AUGMENTIN Take 1 tablet by mouth 2 (two) times daily for 7 days. What changed: when to take this   doxycycline 100 MG capsule Commonly known as: MONODOX Take 1 capsule (100 mg total) by mouth 2 (two) times daily.   ibuprofen 800 MG tablet Commonly known as: ADVIL Take 1 tablet (800 mg total) by mouth every 8 (eight) hours as needed. What changed:  when to take this reasons to take this               Discharge Care Instructions  (From admission, onward)           Start     Ordered   06/17/22 0000  Discharge wound care:       Comments: Follow-up with ENT on 6/4 to have packing changed   06/17/22 1045            Disposition and follow-up:   Angela Hammond was discharged from [facility] in Stable condition.  At the hospital follow up visit please address:  1.  Follow-up: a. Submandibular abscess- Ensure completion of augmentin and doxycycline. She has follow-up with ENT 6/4.  B. Microcytic anemia- get iron studies as outpatient  2.  Labs / imaging needed at time of follow-up: CBC, BMP, iron studies  3. Labs pending: wound culture  4.  Medication Changes Abx -  Augmentin 875-125 mg BID End Date: 6/9 Abx-    Doxycycline 100 mg BID        End Date: 6/9    Follow-up Appointments:  Follow-up Information     [facility]. Schedule an appointment as soon as possible for a visit.   Why: Please call the clinic and schedule a hospital follow up in the next 7-10 days after you are discharged home from the hospital. Contact information: [address] [place] [address] [phone]                Hospital Course by problem list: Left submandibular abscess Leukocytosis Patient presents with left facial swelling with a history of multiple teeth extractions and recent other dental procedures. She had I&D in ED. ENT evaluated her and she was started on Unasyn and given 3 doses of steroids. CT showed residual postprocedural changes and some residual fluid with packing material in place around abscess. ENT did daily washout for 2 days. Her swelling improved and she was discharged with augmentin and doxycycline to complete 10 day course of antibiotics. She will follow-up with ENT 6/4 to change packing.  Hypokalemia K2.9 at admission. Repleted in ED.   Microcytic anemia Hemoglobin with drop of about 2 from 11.4 9 months ago. With microcytic anemia possibly due to blood loss. Plan to check iron studies as outpatient and replete as indicate once infection clears.   Discharge Subjective: Patient evaluated at bedside this AM.  She reports improvement in pain and she has been able to eat ok.   I called and talked with patient about discharge planning. She requested that antibiotics be sent to [facility] on [address]. I provided contact information for ENT for which she will follow-up on 6/4. She plans to call [facility] to reschedule hospital follow-up for morning.  Discharge Exam:   BP 106/66 (BP Location: Right Arm)   Pulse (!) 57   Temp 98.3 F (36.8 C)   Resp 17   Ht 5\' 3"  (1.6 m)   Wt 97.5 kg   LMP 05/22/2022   SpO2 98%   BMI 38.09 kg/m  Constitutional: well-appearing HENT: swelling with induration present to left submandibular space, packing in place with serosanguinous fluid   Cardiovascular: regular rate and rhythm, no m/r/g Pulmonary/Chest: normal work of breathing on room air, lungs clear to auscultation bilaterally  Pertinent Labs, Studies, and Procedures:     Latest Ref Rng & Units 06/17/2022    4:35 AM 06/16/2022    3:18 AM 06/15/2022    6:04 AM  CBC  WBC 4.0 - 10.5 K/uL 8.4  11.5  8.0   Hemoglobin 12.0 - 15.0 g/dL 9.7  16.1  09.6   Hematocrit 36.0 - 46.0 % 31.1  33.4  33.4   Platelets 150 - 400 K/uL 270  274  273        Latest Ref Rng & Units 06/16/2022    3:18 AM 06/15/2022    6:04 AM 06/14/2022    9:44 AM  CMP  Glucose 70 - 99 mg/dL 045  409  89   BUN 6 - 20 mg/dL 10  6  5    Creatinine 0.44 - 1.00 mg/dL 8.11  9.14  7.82   Sodium 135 - 145 mmol/L 136  137  137   Potassium 3.5 - 5.1 mmol/L 3.9  4.1  2.9   Chloride 98 - 111 mmol/L 105  105  104   CO2 22 - 32 mmol/L 24  21  22    Calcium 8.9 - 10.3 mg/dL 8.6  9.0  8.5     CT Soft Tissue Neck W Contrast  Result Date: 06/14/2022 CLINICAL DATA:  Soft tissue infection suspected, neck, xray done signficant facial swelling s/p IANDD EXAM: CT NECK WITH CONTRAST TECHNIQUE: Multidetector CT imaging of the neck was performed using the standard protocol following the bolus administration of intravenous contrast. RADIATION DOSE REDUCTION: This exam was performed according to the departmental dose-optimization program which includes automated exposure control, adjustment of the mA and/or kV according to patient size and/or use of iterative reconstruction technique. CONTRAST:  75mL OMNIPAQUE IOHEXOL 350 MG/ML SOLN COMPARISON:  None Available. FINDINGS: Pharynx and larynx: Bilateral tonsils are normal in appearance. The epiglottis is normal in appearance. No evidence of retropharyngeal edema. Salivary glands: Bilateral parotid glands are normal in appearance. There is soft tissue stranding in the left submandibular/perimandibular region abuts the left submandibular gland (series 6, image 67). Thyroid: Normal. Lymph nodes:  Prominent level 1A lymph nodes measuring up to 7 mm, favored to be reactive. Vascular: Negative. Limited intracranial: Negative. Visualized orbits: Negative. Mastoids and visualized paranasal sinuses: Clear. Skeleton: No acute or aggressive process. Upper chest: Negative. Other: Postprocedural changes from recent incision and drainage a left submandibular/perimandibular odontogenic soft tissue abscess (note pre-procedural imaging is not available for comparison). There is a large periapical lucency at the base of the left mandibular molar (series 4, image 64) with cortical breakthrough. This is immediately adjacent to the  region of soft tissue stranding. There is hyperdense curvilinear packing material and air in the region of soft tissue stranding with a small of residual fluid along the posterior aspect of the packing material (series 6, image 68). Soft tissue stranding involves the subcutaneous soft tissues, the submandibular space and the submental space (series 5, image 43). IMPRESSION: 1. Postprocedural changes from recent incision and drainage of a left submandibular/perimandibular odontogenic soft tissue abscess with packing material in place. There is a small of residual fluid along the posterior aspect of the packing material, which could represent residual or recurrent abscess. 2. Soft tissue stranding involves the subcutaneous soft tissues, the submandibular space and the submental space. Preprocedural imaging is not available to assess for interval change hazy. Electronically Signed   By: Lorenza Cambridge M.D.   On: 06/14/2022 11:25     Discharge Instructions: Discharge Instructions     Diet - low sodium heart healthy   Complete by: As directed    Discharge instructions   Complete by: As directed    Ms. Paredez,  You were admitted with an abscess of your tooth. Please follow-up with Dr. Harvie Junior office (ENT office) on Tuesday, June 4th, to redress the abscess. I have included the contact  information below.  I would also like you to follow-up with the Internal Medicine Clinic on June 13th at 1:15. If that time doesn't work please call to re-schedule at [phone].  You will have 1 week of antibiotics to take with Augmentin and Doxycline. I sent those medications to [facility]. If you have pain, please take tylenol 1000mg  every 6 hours and ibuprofen 800 mg every 8hrs as needed.  [facility] [place] [phone] [address] [place], [place] [address] [phone]  [facility] [address] Entr[address]; Ground Floor - [facility], [place], [place] [address] Phone: [phone]  Thank you, Dr. Sloan Leiter   Discharge wound care:   Complete by: As directed    Follow-up with ENT on 6/4 to have packing changed   Increase activity slowly   Complete by: As directed        Signed: Rudene Christians, DO 06/17/2022, 11:41 AM   Pager: [phone]

## 2022-06-19 LAB — AEROBIC/ANAEROBIC CULTURE W GRAM STAIN (SURGICAL/DEEP WOUND)

## 2022-06-20 DIAGNOSIS — L0211 Cutaneous abscess of neck: Secondary | ICD-10-CM | POA: Insufficient documentation

## 2022-06-21 LAB — AEROBIC/ANAEROBIC CULTURE W GRAM STAIN (SURGICAL/DEEP WOUND): Gram Stain: NONE SEEN

## 2022-06-22 LAB — AEROBIC/ANAEROBIC CULTURE W GRAM STAIN (SURGICAL/DEEP WOUND): Culture: NO GROWTH

## 2022-06-28 ENCOUNTER — Encounter: Payer: Medicaid Other | Admitting: Internal Medicine

## 2023-01-24 ENCOUNTER — Encounter (HOSPITAL_COMMUNITY): Payer: Self-pay

## 2023-01-24 ENCOUNTER — Ambulatory Visit (HOSPITAL_COMMUNITY)
Admission: EM | Admit: 2023-01-24 | Discharge: 2023-01-24 | Disposition: A | Discharge status: Home / Self Care | Payer: Medicaid Other | Attending: Family Medicine | Admitting: Family Medicine

## 2023-01-24 DIAGNOSIS — Z113 Encounter for screening for infections with a predominantly sexual mode of transmission: Secondary | ICD-10-CM | POA: Diagnosis present

## 2023-01-24 LAB — HIV ANTIBODY (ROUTINE TESTING W REFLEX): HIV Screen 4th Generation wRfx: NONREACTIVE

## 2023-01-24 NOTE — Discharge Instructions (Signed)
 You were seen today for STD screening.  Your lab work and swab will be resulted tomorrow and you will be notified if anything is positive.  Please avoid intercourse until results are completed.

## 2023-01-24 NOTE — ED Provider Notes (Signed)
 [facility]    CSN: [id number] Arrival date & time: 01/24/23  9083      History   Chief Complaint Chief Complaint  Patient presents with   SEXUALLY TRANSMITTED DISEASE    HPI Angela Hammond is a 28 y.o. female.   Patient is here for STD screening.  No symptoms, no exposures.  She would like blood work and swab.        Past Medical History:  Diagnosis Date   Gonorrhea 04/19/2011   History of gonorrhea    2013   Morbid obesity (HCC) 04/18/2011   Pelvic inflammatory disease (PID) 04/18/2011   Previable preterm premature rupture of membranes at [redacted] weeks gestation 07/08/2020    Patient Active Problem List   Diagnosis Date Noted   Dental abscess 06/15/2022   Odontogenic infection of jaw 06/14/2022   Preterm labor in second trimester 08/31/2021   Alpha thalassemia silent carrier 08/08/2021   Genetic carrier - at risk of SMA carrier 08/08/2021   Hx of one miscarriage 08/08/2020   Morbid obesity (HCC) 04/18/2011    Past Surgical History:  Procedure Laterality Date   TONSILLECTOMY     AGE 68    OB History     Gravida  1   Para  1   Term  0   Preterm  0   AB  0   Living  0      SAB  0   IAB  0   Ectopic  0   Multiple  0   Live Births  0            Home Medications    Prior to Admission medications   Medication Sig Start Date End Date Taking? Authorizing Provider  acetaminophen  (TYLENOL ) 500 MG tablet Take 2 tablets (1,000 mg total) by mouth every 6 (six) hours. 06/17/22   Masters, Katie, DO  doxycycline  (MONODOX ) 100 MG capsule Take 1 capsule (100 mg total) by mouth 2 (two) times daily. 06/17/22   Masters, Katie, DO  ibuprofen  (ADVIL ) 800 MG tablet Take 1 tablet (800 mg total) by mouth every 8 (eight) hours as needed. 06/17/22   Masters, Katie, DO  dicyclomine  (BENTYL ) 20 MG tablet Take 1 tablet (20 mg total) by mouth 2 (two) times daily as needed for spasms. 03/17/18 01/27/19  Caccavale, Sophia, PA-C  fluticasone  (FLONASE ) 50  MCG/ACT nasal spray Place 1 spray into both nostrils daily. 10/12/17 01/27/19  Rendell Lorane HERO, PA-C    Family History Family History  Problem Relation Age of Onset   Stroke Mother    Hypertension Mother    Hypertension Father    Diabetes Maternal Aunt    Stroke Maternal Aunt    Hypertension Maternal Aunt    Stroke Maternal Grandfather    Hypertension Maternal Grandfather     Social History Social History   Tobacco Use   Smoking status: Never   Smokeless tobacco: Never  Vaping Use   Vaping status: Never Used  Substance Use Topics   Alcohol use: Not Currently   Drug use: No     Allergies   Patient has no known allergies.   Review of Systems Review of Systems  Constitutional: Negative.   HENT: Negative.    Respiratory: Negative.    Cardiovascular: Negative.   Gastrointestinal: Negative.   Musculoskeletal: Negative.      Physical Exam Triage Vital Signs ED Triage Vitals  Encounter Vitals Group     BP 01/24/23 1001 124/84  Systolic BP Percentile --      Diastolic BP Percentile --      Pulse Rate 01/24/23 1001 82     Resp 01/24/23 1001 16     Temp 01/24/23 1001 98.2 F (36.8 C)     Temp Source 01/24/23 1001 Oral     SpO2 01/24/23 1001 100 %     Weight 01/24/23 1001 220 lb (99.8 kg)     Height 01/24/23 1001 5' 3 (1.6 m)     Head Circumference --      Peak Flow --      Pain Score 01/24/23 1000 0     Pain Loc --      Pain Education --      Exclude from Growth Chart --    No data found.  Updated Vital Signs BP 124/84 (BP Location: Right Arm)   Pulse 82   Temp 98.2 F (36.8 C) (Oral)   Resp 16   Ht 5' 3 (1.6 m)   Wt 99.8 kg   LMP 12/31/2022 (Exact Date)   SpO2 100%   Breastfeeding No   BMI 38.97 kg/m   Visual Acuity Right Eye Distance:   Left Eye Distance:   Bilateral Distance:    Right Eye Near:   Left Eye Near:    Bilateral Near:     Physical Exam Constitutional:      Appearance: Normal appearance.  Cardiovascular:     Rate  and Rhythm: Normal rate and regular rhythm.  Skin:    General: Skin is warm.  Neurological:     General: No focal deficit present.     Mental Status: She is alert.  Psychiatric:        Mood and Affect: Mood normal.      UC Treatments / Results  Labs (all labs ordered are listed, but only abnormal results are displayed) Labs Reviewed  RPR  HIV ANTIBODY (ROUTINE TESTING W REFLEX)  CERVICOVAGINAL ANCILLARY ONLY    EKG   Radiology No results found.  Procedures Procedures (including critical care time)  Medications Ordered in UC Medications - No data to display  Initial Impression / Assessment and Plan / UC Course  I have reviewed the triage vital signs and the nursing notes.  Pertinent labs & imaging results that were available during my care of the patient were reviewed by me and considered in my medical decision making (see chart for details).    Final Clinical Impressions(s) / UC Diagnoses   Final diagnoses:  Screening for STD (sexually transmitted disease)     Discharge Instructions      You were seen today for STD screening.  Your lab work and swab will be resulted tomorrow and you will be notified if anything is positive.  Please avoid intercourse until results are completed.     ED Prescriptions   None    PDMP not reviewed this encounter.   Darral Longs, MD 01/24/23 1019

## 2023-01-24 NOTE — ED Triage Notes (Signed)
 STI testing, no symptoms. No known exposures. Routine testing. Patient requesting blood work and swab.

## 2023-01-25 LAB — RPR: RPR Ser Ql: NONREACTIVE

## 2023-01-25 LAB — CERVICOVAGINAL ANCILLARY ONLY
Bacterial Vaginitis (gardnerella): POSITIVE — AB
Candida Glabrata: NEGATIVE
Candida Vaginitis: NEGATIVE
Chlamydia: NEGATIVE
Comment: NEGATIVE
Comment: NEGATIVE
Comment: NEGATIVE
Comment: NEGATIVE
Comment: NEGATIVE
Comment: NORMAL
Neisseria Gonorrhea: NEGATIVE
Trichomonas: NEGATIVE

## 2023-05-13 ENCOUNTER — Ambulatory Visit (HOSPITAL_COMMUNITY)
Admission: EM | Admit: 2023-05-13 | Discharge: 2023-05-13 | Disposition: A | Discharge status: Home / Self Care | Attending: Family Medicine | Admitting: Family Medicine

## 2023-05-13 ENCOUNTER — Other Ambulatory Visit: Payer: Self-pay

## 2023-05-13 ENCOUNTER — Encounter (HOSPITAL_COMMUNITY): Payer: Self-pay | Admitting: *Deleted

## 2023-05-13 DIAGNOSIS — Z3A01 Less than 8 weeks gestation of pregnancy: Secondary | ICD-10-CM

## 2023-05-13 DIAGNOSIS — Z3201 Encounter for pregnancy test, result positive: Secondary | ICD-10-CM | POA: Diagnosis not present

## 2023-05-13 LAB — POCT URINE PREGNANCY: Preg Test, Ur: POSITIVE — AB

## 2023-05-13 NOTE — ED Triage Notes (Signed)
 PT reports 3 positive pregnancy tests at home.

## 2023-05-13 NOTE — ED Triage Notes (Signed)
 PT DOB and full name confirmed before triage.

## 2023-05-13 NOTE — ED Provider Notes (Signed)
 [facility]    CSN: [id number] Arrival date & time: 05/13/23  1017      History   Chief Complaint Chief Complaint  Patient presents with   Possible Pregnancy    HPI Angela Hammond is a 28 y.o. female.    Possible Pregnancy  Patient is here for positive home pregnancy test x 3.  Her LMP was around march 16th.  Asks about her options if she plan to abort the child.        Past Medical History:  Diagnosis Date   Gonorrhea 04/19/2011   History of gonorrhea    2013   Morbid obesity (HCC) 04/18/2011   Pelvic inflammatory disease (PID) 04/18/2011   Previable preterm premature rupture of membranes at [redacted] weeks gestation 07/08/2020    Patient Active Problem List   Diagnosis Date Noted   Dental abscess 06/15/2022   Odontogenic infection of jaw 06/14/2022   Preterm labor in second trimester 08/31/2021   Alpha thalassemia silent carrier 08/08/2021   Genetic carrier - at risk of SMA carrier 08/08/2021   Hx of one miscarriage 08/08/2020   Morbid obesity (HCC) 04/18/2011    Past Surgical History:  Procedure Laterality Date   TONSILLECTOMY     AGE 30    OB History     Gravida  1   Para  1   Term  0   Preterm  0   AB  0   Living  0      SAB  0   IAB  0   Ectopic  0   Multiple  0   Live Births  0            Home Medications    Prior to Admission medications   Medication Sig Start Date End Date Taking? Authorizing Provider  dicyclomine  (BENTYL ) 20 MG tablet Take 1 tablet (20 mg total) by mouth 2 (two) times daily as needed for spasms. 03/17/18 01/27/19  Caccavale, Sophia, PA-C  fluticasone  (FLONASE ) 50 MCG/ACT nasal spray Place 1 spray into both nostrils daily. 10/12/17 01/27/19  Martie Slaughter, PA-C    Family History Family History  Problem Relation Age of Onset   Stroke Mother    Hypertension Mother    Hypertension Father    Diabetes Maternal Aunt    Stroke Maternal Aunt    Hypertension Maternal Aunt    Stroke Maternal  Grandfather    Hypertension Maternal Grandfather     Social History Social History   Tobacco Use   Smoking status: Never   Smokeless tobacco: Never  Vaping Use   Vaping status: Never Used  Substance Use Topics   Alcohol use: Not Currently   Drug use: No     Allergies   Patient has no known allergies.   Review of Systems Review of Systems  Constitutional: Negative.   HENT: Negative.    Respiratory: Negative.    Cardiovascular: Negative.   Gastrointestinal: Negative.   Musculoskeletal: Negative.      Physical Exam Triage Vital Signs ED Triage Vitals [05/13/23 1108]  Encounter Vitals Group     BP 107/73     Systolic BP Percentile      Diastolic BP Percentile      Pulse Rate 74     Resp 16     Temp 98.8 F (37.1 C)     Temp src      SpO2 100 %     Weight      Height  Head Circumference      Peak Flow      Pain Score      Pain Loc      Pain Education      Exclude from Growth Chart    No data found.  Updated Vital Signs BP 107/73   Pulse 74   Temp 98.8 F (37.1 C)   Resp 16   LMP 03/31/2023 (Approximate)   SpO2 100%   Visual Acuity Right Eye Distance:   Left Eye Distance:   Bilateral Distance:    Right Eye Near:   Left Eye Near:    Bilateral Near:     Physical Exam Constitutional:      Appearance: Normal appearance. She is normal weight.  Cardiovascular:     Rate and Rhythm: Normal rate and regular rhythm.  Pulmonary:     Effort: Pulmonary effort is normal.     Breath sounds: Normal breath sounds.  Neurological:     General: No focal deficit present.     Mental Status: She is alert.  Psychiatric:        Mood and Affect: Mood normal.      UC Treatments / Results  Labs (all labs ordered are listed, but only abnormal results are displayed) Labs Reviewed  POCT URINE PREGNANCY   UPT positive  EKG   Radiology No results found.  Procedures Procedures (including critical care time)  Medications Ordered in  UC Medications - No data to display  Initial Impression / Assessment and Plan / UC Course  I have reviewed the triage vital signs and the nursing notes.  Pertinent labs & imaging results that were available during my care of the patient were reviewed by me and considered in my medical decision making (see chart for details).   Final Clinical Impressions(s) / UC Diagnoses   Final diagnoses:  Less than [redacted] weeks gestation of pregnancy     Discharge Instructions      You were seen today for positive pregnancy test.   You are around [redacted] weeks pregnant based on your last menstrual period.  If you plan to keep the child, then please follow up with an ob/gyn.  If you plan to abort, you may follow up with [facility] by calling [phone].     ED Prescriptions   None    PDMP not reviewed this encounter.   Lesle Ras, MD 05/13/23 [phone]

## 2023-05-13 NOTE — Discharge Instructions (Addendum)
 You were seen today for positive pregnancy test.   You are around [redacted] weeks pregnant based on your last menstrual period.  If you plan to keep the child, then please follow up with an ob/gyn.  If you plan to abort, you may follow up with [facility] by calling [phone].

## 2023-05-30 ENCOUNTER — Encounter (HOSPITAL_COMMUNITY): Payer: Self-pay | Admitting: *Deleted

## 2023-05-30 ENCOUNTER — Inpatient Hospital Stay (HOSPITAL_COMMUNITY)

## 2023-05-30 ENCOUNTER — Inpatient Hospital Stay (HOSPITAL_COMMUNITY)
Admission: AD | Admit: 2023-05-30 | Discharge: 2023-05-30 | Disposition: A | Discharge status: Home / Self Care | Attending: Obstetrics & Gynecology | Admitting: Obstetrics & Gynecology

## 2023-05-30 DIAGNOSIS — O209 Hemorrhage in early pregnancy, unspecified: Secondary | ICD-10-CM | POA: Diagnosis present

## 2023-05-30 DIAGNOSIS — O3680X0 Pregnancy with inconclusive fetal viability, not applicable or unspecified: Secondary | ICD-10-CM

## 2023-05-30 DIAGNOSIS — Z3A08 8 weeks gestation of pregnancy: Secondary | ICD-10-CM | POA: Diagnosis not present

## 2023-05-30 DIAGNOSIS — O039 Complete or unspecified spontaneous abortion without complication: Secondary | ICD-10-CM | POA: Diagnosis not present

## 2023-05-30 HISTORY — DX: Depression, unspecified: F32.A

## 2023-05-30 HISTORY — DX: Anxiety disorder, unspecified: F41.9

## 2023-05-30 LAB — WET PREP, GENITAL
Sperm: NONE SEEN
Trich, Wet Prep: NONE SEEN
WBC, Wet Prep HPF POC: <10 (ref <10)
Yeast Wet Prep HPF POC: NONE SEEN

## 2023-05-30 LAB — CBC
HCT: 37.2 % (ref 36.0–46.0)
Hemoglobin: 11.8 g/dL — ABNORMAL LOW (ref 12.0–15.0)
MCH: 25.5 pg — ABNORMAL LOW (ref 26.0–34.0)
MCHC: 31.7 g/dL (ref 30.0–36.0)
MCV: 80.5 fL (ref 80.0–100.0)
Platelets: 220 10*3/uL (ref 150–400)
RBC: 4.62 MIL/uL (ref 3.87–5.11)
RDW: 15.8 % — ABNORMAL HIGH (ref 11.5–15.5)
WBC: 6.3 10*3/uL (ref 4.0–10.5)
nRBC: 0 % (ref 0.0–0.2)

## 2023-05-30 LAB — HCG, QUANTITATIVE, PREGNANCY: hCG, Beta Chain, Quant, S: 195 m[IU]/mL — ABNORMAL HIGH (ref <5)

## 2023-05-30 NOTE — MAU Note (Signed)
 Angela Hammond is a 28 y.o. at [redacted]w[redacted]d here in MAU reporting: is early in pregnancy.  Has been experiencing some bleeding. Is like a period. Had a little light bleeding last wk. Became heavier last night. Moderate amt on pad from last night. Passed, small.  Also having some cramping.   Onset of complaint: heavier last night Pain score: mild Vitals:   05/30/23 0944  BP: 135/89  Pulse: 85  Resp: 16  Temp: 98 F (36.7 C)  SpO2: 100%      Lab orders placed from triage:  vag swabs

## 2023-05-30 NOTE — Discharge Instructions (Signed)
 It was a pleasure seeing you today.  I am sorry you are going through this.  It looks like you may have had a miscarriage.  Because we never saw anything in your uterus you need repeat lab work in 48 hours.  This is to make sure that you you do not have an ectopic pregnancy which is very dangerous.  If you have bleeding or you feeling a pad an hour for more than 2 hours in a row you need to return.  If your pain gets considerably worse you need to return.  If you start having any fevers need to return.  Please let us  know if there is anything we can do.  I have scheduled the appointment but be sure to come back Saturday morning for the repeat hCG.

## 2023-05-31 LAB — GC/CHLAMYDIA PROBE AMP (~~LOC~~) NOT AT ARMC
Chlamydia: NEGATIVE
Comment: NEGATIVE
Comment: NORMAL
Neisseria Gonorrhea: NEGATIVE

## 2023-06-01 ENCOUNTER — Inpatient Hospital Stay (HOSPITAL_COMMUNITY)
Admission: AD | Admit: 2023-06-01 | Discharge: 2023-06-01 | Disposition: A | Discharge status: Home / Self Care | Source: Home / Self Care | Attending: Obstetrics & Gynecology | Admitting: Obstetrics & Gynecology

## 2023-06-01 ENCOUNTER — Inpatient Hospital Stay (HOSPITAL_COMMUNITY)
Admission: AD | Admit: 2023-06-01 | Discharge: 2023-06-01 | Disposition: A | Discharge status: Home / Self Care | Attending: Obstetrics and Gynecology | Admitting: Obstetrics and Gynecology

## 2023-06-01 ENCOUNTER — Ambulatory Visit: Payer: Self-pay | Admitting: Certified Nurse Midwife

## 2023-06-01 DIAGNOSIS — Z3A08 8 weeks gestation of pregnancy: Secondary | ICD-10-CM | POA: Diagnosis not present

## 2023-06-01 DIAGNOSIS — O3680X0 Pregnancy with inconclusive fetal viability, not applicable or unspecified: Secondary | ICD-10-CM

## 2023-06-01 DIAGNOSIS — O200 Threatened abortion: Secondary | ICD-10-CM | POA: Diagnosis present

## 2023-06-01 DIAGNOSIS — O039 Complete or unspecified spontaneous abortion without complication: Secondary | ICD-10-CM

## 2023-06-01 LAB — HCG, QUANTITATIVE, PREGNANCY: hCG, Beta Chain, Quant, S: 63 m[IU]/mL — ABNORMAL HIGH (ref <5)

## 2023-06-01 NOTE — Progress Notes (Signed)
 Patient seen in MAU 06/01/23 for follow up betaHcG from 05/30/23 MAU visit. Beta decreasing. Plan to follow to zero. Patient called with information and follow up at [facility].  Please schedule this patient for a follow up beta lab 5/23 or 5/26.   Thank you, Abraham Hoffmann

## 2023-06-01 NOTE — Discharge Instructions (Signed)
 Angela Hammond,  I am so sorry that you are dealing with this. We will be in contact with the lab results and next steps.  Thank you for trusting us  to care for you, Abraham Hoffmann, Midwife

## 2023-06-01 NOTE — MAU Note (Signed)
 Angela Hammond is a 29 y.o. at [redacted]w[redacted]d here in MAU reporting: Recheck HCG levels. Reports some slight cramping and some spotting.  LMP: 03/31/2023 Onset of complaint: 05/30/2023 Pain score: 5/10 Vitals:   06/01/23 0905  BP: (!) 133/93  Pulse: 68  Resp: 18  Temp: 98.1 F (36.7 C)  SpO2: 100%     FHT:   Lab orders placed from triage: HCG

## 2023-06-01 NOTE — MAU Provider Note (Signed)
 None     S [name] is a 28 y.o. G3P0010 pregnant female at [redacted]w[redacted]d who presents to MAU today with complaint of follow up betaHcG for suspected miscarriage. She reports continued bleeding, with passage of some tissue. She reports moderate bleeding that requires her to change her pads every few hours. Her pain is well controlled with OTC pain medication.    Pertinent items noted in HPI and remainder of comprehensive ROS otherwise negative.   O BP (!) 133/93 (BP Location: Right Arm)   Pulse 68   Temp 98.1 F (36.7 C) (Oral)   Resp 18   Ht 5\' 3"  (1.6 m)   Wt 107.4 kg   LMP 03/31/2023 (Approximate)   SpO2 100%   BMI 41.93 kg/m  Physical Exam Vitals and nursing note reviewed.  Constitutional:      Appearance: Normal appearance.  HENT:     Head: Normocephalic.  Cardiovascular:     Rate and Rhythm: Normal rate.     Pulses: Normal pulses.  Pulmonary:     Effort: Pulmonary effort is normal.  Skin:    General: Skin is warm and dry.     Capillary Refill: Capillary refill takes less than 2 seconds.  Neurological:     Mental Status: She is alert and oriented to person, place, and time.  Psychiatric:        Mood and Affect: Mood normal.        Behavior: Behavior normal.        Thought Content: Thought content normal.        Judgment: Judgment normal.     Comments: Patient appropriate for situation       MDM: Reviewed EMR Beta Hcg to trend, decreasing appropriately  MAU Course:  A Pregnancy of unknown anatomic location  Miscarriage, threatened, early pregnancy  - Beta Hcg trending down. Patient stable with moderate bleeding that is decreasing and pain that is well controlled.   Medical screening exam complete  P Discharge from MAU in stable condition with routine bleeding precautions Patient stable, with low suspicion for ectopic pregnancy due to clinical presentation. Patient amenable to discharge and to be called with results.  - Call to patient, identified  with full name and DOB to ensure I was speaking to correct patient. Patient informed of results and plan made for patient to follow up at [facility] for lab draw 5/23 or 5/26 to trend HcG down. Message sent and orders placed.   Allergies as of 06/01/2023   No Known Allergies      Medication List    You have not been prescribed any medications.     Angela Bunk, MSN, CNM 06/01/2023 10:19 AM  Certified Nurse Midwife, [facility]

## 2023-06-10 ENCOUNTER — Inpatient Hospital Stay (HOSPITAL_COMMUNITY)
Admission: AD | Admit: 2023-06-10 | Discharge: 2023-06-10 | Disposition: A | Discharge status: Home / Self Care | Payer: Self-pay | Attending: Obstetrics and Gynecology | Admitting: Obstetrics and Gynecology

## 2023-06-10 ENCOUNTER — Encounter (HOSPITAL_COMMUNITY): Payer: Self-pay | Admitting: Obstetrics and Gynecology

## 2023-06-10 DIAGNOSIS — Z3A10 10 weeks gestation of pregnancy: Secondary | ICD-10-CM

## 2023-06-10 DIAGNOSIS — O039 Complete or unspecified spontaneous abortion without complication: Secondary | ICD-10-CM | POA: Diagnosis present

## 2023-06-10 LAB — HCG, QUANTITATIVE, PREGNANCY: hCG, Beta Chain, Quant, S: 2 m[IU]/mL (ref <5)

## 2023-06-10 NOTE — MAU Note (Signed)
..  Angela Hammond is a 28 y.o. at [redacted]w[redacted]d here in MAU reporting: here for repeat HCG for suspected miscarriage. Denies VB or abdominal pain. Did not go to [facility] for bhcg, last hcg was 63 on 5/17.  Pain score: 0 Vitals:   06/10/23 1010  BP: (!) 142/95  Pulse: 82  Resp: 14  Temp: 98 F (36.7 C)  SpO2: 100%      Lab orders placed from triage:   hcg

## 2023-06-10 NOTE — Discharge Instructions (Signed)
 Angela Hammond,  I am sorry you are going through this. We will follow your hormone levels down to zero. I will call you with results.   Thank you for trusting us  to care for you, Abraham Hoffmann, Midwife

## 2023-06-10 NOTE — MAU Provider Note (Signed)
 None     S Ms. KAPRI NERO is a 28 y.o. G20P0020 pregnant female at [redacted]w[redacted]d who presents to MAU today with complaint of return for trending beta HCG. Her bleeding has stopped and she reports she is no longer having pain.  She has not yet initiated prenatal care.   Pertinent items noted in HPI and remainder of comprehensive ROS otherwise negative.   O BP (!) 142/95 (BP Location: Right Arm)   Pulse 82   Temp 98 F (36.7 C) (Oral)   Resp 14   Wt 107.6 kg   LMP 03/31/2023 (Approximate)   SpO2 100%   BMI 42.02 kg/m  Physical Exam Vitals and nursing note reviewed.  Constitutional:      General: She is not in acute distress.    Appearance: Normal appearance. She is not ill-appearing, toxic-appearing or diaphoretic.  HENT:     Head: Normocephalic.  Cardiovascular:     Rate and Rhythm: Normal rate.     Pulses: Normal pulses.  Pulmonary:     Effort: Pulmonary effort is normal.  Skin:    General: Skin is warm and dry.     Capillary Refill: Capillary refill takes less than 2 seconds.  Neurological:     Mental Status: She is alert and oriented to person, place, and time.  Psychiatric:        Mood and Affect: Mood normal.        Behavior: Behavior normal.        Thought Content: Thought content normal.        Judgment: Judgment normal.      MDM: Likely completed miscarriage given resolution of bleeding and symptoms of pain.  Reviewed EMR.  Her beta was initially 195 (05/30/23) and dropped to 63 (5/17.25). Now at 2.   MAU Course:  A Miscarriage  Medical screening exam complete  Beta hcg returned at 2, given resolution of symptoms, this miscarriage can be considered complete.   P - Discharge from MAU in stable condition with bleeding and infection precautions. Patient discharged before results ready, patient prefers to be called with results at listed number. Verified number with patient.  - Message sent to office for provider appointment to ensure safe resolution of  pregnancy and ultrasound appointment.  Follow up at [facility]  for ultrasound to ensure completion of pregnancy.   Allergies as of 06/10/2023   No Known Allergies      Medication List    You have not been prescribed any medications.     Raford Bunk, MSN, CNM 06/10/2023 12:04 PM  Certified Nurse Midwife, [facility]

## 2023-06-12 ENCOUNTER — Telehealth

## 2023-06-13 ENCOUNTER — Other Ambulatory Visit: Payer: Self-pay | Admitting: Lactation Services

## 2023-06-13 DIAGNOSIS — O039 Complete or unspecified spontaneous abortion without complication: Secondary | ICD-10-CM

## 2023-06-20 ENCOUNTER — Encounter: Payer: Self-pay | Admitting: Obstetrics and Gynecology

## 2023-06-27 ENCOUNTER — Ambulatory Visit (HOSPITAL_COMMUNITY)
Admission: RE | Admit: 2023-06-27 | Discharge: 2023-06-27 | Disposition: A | Discharge status: Home / Self Care | Source: Ambulatory Visit | Attending: Certified Nurse Midwife | Admitting: Certified Nurse Midwife

## 2023-06-27 DIAGNOSIS — O039 Complete or unspecified spontaneous abortion without complication: Secondary | ICD-10-CM | POA: Diagnosis present

## 2023-07-09 NOTE — Progress Notes (Unsigned)
 History:  [name] is a 28 y.o. G3P0020 who presents to clinic today for follow up after missed abortion seen in MAU.    She reports 2 days of light spotting after total resolution of bleeding for several weeks. This spotting occurred early in the week, and she has had some mild cramping since.   Patient reports several episodes unprotected sex in the past several weeks. She uses condoms inconsistently. She reports her partner doesn't want to use condoms. She does not desire pregnancy currently, nor does her partner.  She reports she took a home pregnancy test and it seemed like a positive result.   She has used Depo in the past and she did not like it, she used COCs in the past and does not want to use again. Is open to trying non-hormonal birth contraception.   The following portions of the patient's history were reviewed and updated as appropriate: allergies, current medications, family history, past medical history, social history, past surgical history and problem list.  Review of Systems:  Review of Systems  All other systems reviewed and are negative.     Objective:  Physical Exam BP 117/80   Pulse 85   Wt 235 lb 4.8 oz (106.7 kg)   LMP 03/31/2023 (Approximate)   Breastfeeding No   BMI 41.68 kg/m  Physical Exam Vitals and nursing note reviewed.  Constitutional:      Appearance: Normal appearance.  HENT:     Head: Normocephalic.   Cardiovascular:     Rate and Rhythm: Normal rate and regular rhythm.  Pulmonary:     Effort: Pulmonary effort is normal.     Breath sounds: Normal breath sounds.  Abdominal:     General: Abdomen is flat.     Palpations: Abdomen is soft.   Skin:    General: Skin is warm and dry.     Capillary Refill: Capillary refill takes less than 2 seconds.   Neurological:     Mental Status: She is alert.   Psychiatric:        Mood and Affect: Mood normal.        Behavior: Behavior normal.        Thought Content: Thought content  normal.        Judgment: Judgment normal.      Labs and Imaging No results found for this or any previous visit (from the past 24 hours).  No results found.   Assessment & Plan:  1. Missed abortion (Primary) - BetaHcG drawn today based on symptoms and questionable pos home pregnancy test with unprotected sex in the past month.  Hormone level dropped from 05/30/23: 195, 06/01/23: 63, 06/10/23: 2.   2. Miscarriage - Resolution of pregnancy based on decreased in pregnancy hormone level and ultrasound results. Patient did not present for previously scheduled follow up.  - Given patient report possible positive home pregnancy test, and unprotected sex, new pregnancy cannot be excluded.   3. Contraception - Patient does not desire pregnancy currently. She does not want hormonal birth control and reports her partner is generally not receptive to using condoms. Counseled on options, Phexxi Rx sent. [email or web address] given as resource.   Regino Camie LABOR, CNM 07/11/2023 11:06 AM

## 2023-07-11 ENCOUNTER — Ambulatory Visit: Payer: Self-pay | Admitting: Certified Nurse Midwife

## 2023-07-11 ENCOUNTER — Other Ambulatory Visit: Payer: Self-pay

## 2023-07-11 ENCOUNTER — Ambulatory Visit: Admitting: Family Medicine

## 2023-07-11 VITALS — BP 117/80 | HR 85 | Wt 235.3 lb

## 2023-07-11 DIAGNOSIS — O039 Complete or unspecified spontaneous abortion without complication: Secondary | ICD-10-CM

## 2023-07-11 DIAGNOSIS — O021 Missed abortion: Secondary | ICD-10-CM

## 2023-07-11 DIAGNOSIS — Z309 Encounter for contraceptive management, unspecified: Secondary | ICD-10-CM

## 2023-07-11 DIAGNOSIS — Z3A00 Weeks of gestation of pregnancy not specified: Secondary | ICD-10-CM

## 2023-07-11 MED ORDER — PHEXXI 1.8-1-0.4 % VA GEL: 5.0000 g | VAGINAL | 11 refills | Status: DC | PRN

## 2023-07-11 NOTE — Patient Instructions (Signed)
 [email or web address]

## 2023-07-12 ENCOUNTER — Ambulatory Visit: Payer: Self-pay | Admitting: Certified Nurse Midwife

## 2023-07-12 LAB — BETA HCG QUANT (REF LAB): hCG Quant: 2911 m[IU]/mL

## 2023-07-12 NOTE — Progress Notes (Signed)
 Patient seen yesterday 07/11/23 for follow up after miscarriage. Has had unprotected sex after miscarriage and took a pregnancy test due to symptoms. Quant Beta HcG 2911 07/11/23. This is consistent with a new pregnancy.  I attempted to call patient, no answer. Please inform and offer resources.  Camie Rote, MSN, CNM, RNC-OB Certified Nurse Midwife, [facility] 07/12/2023 10:51 AM

## 2023-08-02 ENCOUNTER — Encounter (HOSPITAL_COMMUNITY): Payer: Self-pay | Admitting: *Deleted

## 2023-08-02 ENCOUNTER — Inpatient Hospital Stay (HOSPITAL_COMMUNITY)
Admission: AD | Admit: 2023-08-02 | Discharge: 2023-08-02 | Disposition: A | Discharge status: Home / Self Care | Attending: Obstetrics and Gynecology | Admitting: Obstetrics and Gynecology

## 2023-08-02 ENCOUNTER — Inpatient Hospital Stay (HOSPITAL_COMMUNITY)

## 2023-08-02 DIAGNOSIS — B9689 Other specified bacterial agents as the cause of diseases classified elsewhere: Secondary | ICD-10-CM | POA: Diagnosis not present

## 2023-08-02 DIAGNOSIS — Z3491 Encounter for supervision of normal pregnancy, unspecified, first trimester: Secondary | ICD-10-CM

## 2023-08-02 DIAGNOSIS — O468X1 Other antepartum hemorrhage, first trimester: Secondary | ICD-10-CM | POA: Diagnosis not present

## 2023-08-02 DIAGNOSIS — O208 Other hemorrhage in early pregnancy: Secondary | ICD-10-CM | POA: Diagnosis not present

## 2023-08-02 DIAGNOSIS — R519 Headache, unspecified: Secondary | ICD-10-CM

## 2023-08-02 DIAGNOSIS — O209 Hemorrhage in early pregnancy, unspecified: Secondary | ICD-10-CM

## 2023-08-02 DIAGNOSIS — O23591 Infection of other part of genital tract in pregnancy, first trimester: Secondary | ICD-10-CM | POA: Insufficient documentation

## 2023-08-02 DIAGNOSIS — Z3A01 Less than 8 weeks gestation of pregnancy: Secondary | ICD-10-CM | POA: Diagnosis not present

## 2023-08-02 DIAGNOSIS — O418X10 Other specified disorders of amniotic fluid and membranes, first trimester, not applicable or unspecified: Secondary | ICD-10-CM

## 2023-08-02 HISTORY — DX: Benign neoplasm of connective and other soft tissue, unspecified: D21.9

## 2023-08-02 LAB — CBC
HCT: 36.7 % (ref 36.0–46.0)
Hemoglobin: 11.8 g/dL — ABNORMAL LOW (ref 12.0–15.0)
MCH: 25.7 pg — ABNORMAL LOW (ref 26.0–34.0)
MCHC: 32.2 g/dL (ref 30.0–36.0)
MCV: 80 fL (ref 80.0–100.0)
Platelets: 181 K/uL (ref 150–400)
RBC: 4.59 MIL/uL (ref 3.87–5.11)
RDW: 15.8 % — ABNORMAL HIGH (ref 11.5–15.5)
WBC: 3.5 K/uL — ABNORMAL LOW (ref 4.0–10.5)
nRBC: 0 % (ref 0.0–0.2)

## 2023-08-02 LAB — URINALYSIS, ROUTINE W REFLEX MICROSCOPIC
Bacteria, UA: NONE SEEN
Bilirubin Urine: NEGATIVE
Glucose, UA: NEGATIVE mg/dL
Hgb urine dipstick: NEGATIVE
Ketones, ur: NEGATIVE mg/dL
Nitrite: NEGATIVE
Protein, ur: NEGATIVE mg/dL
Specific Gravity, Urine: 1.018 (ref 1.005–1.030)
pH: 6 (ref 5.0–8.0)

## 2023-08-02 LAB — WET PREP, GENITAL
Sperm: NONE SEEN
Trich, Wet Prep: NONE SEEN
WBC, Wet Prep HPF POC: >=10 — AB (ref <10)
Yeast Wet Prep HPF POC: NONE SEEN

## 2023-08-02 LAB — HCG, QUANTITATIVE, PREGNANCY: hCG, Beta Chain, Quant, S: 101166 m[IU]/mL — ABNORMAL HIGH (ref <5)

## 2023-08-02 MED ORDER — PRENATAL COMPLETE 14-0.4 MG PO TABS: 1.0000 | ORAL_TABLET | Freq: Every day | ORAL | 3 refills | Status: DC

## 2023-08-02 MED ORDER — ACETAMINOPHEN-CAFFEINE 500-65 MG PO TABS
2.0000 | ORAL_TABLET | Freq: Once | ORAL | Status: AC
  Administered 2023-08-02: 2 via ORAL
  Filled 2023-08-02: qty 2

## 2023-08-02 MED ORDER — METRONIDAZOLE 500 MG PO TABS: 500.0000 mg | ORAL_TABLET | Freq: Two times a day (BID) | ORAL | 0 refills | Status: DC

## 2023-08-02 MED ORDER — ONDANSETRON HCL 4 MG PO TABS: 8.0000 mg | ORAL_TABLET | Freq: Two times a day (BID) | ORAL | 0 refills | Status: AC

## 2023-08-02 MED ORDER — ACETAMINOPHEN-CAFFEINE 500-65 MG PO TABS: 2.0000 | ORAL_TABLET | Freq: Four times a day (QID) | ORAL | 0 refills | Status: DC | PRN

## 2023-08-02 NOTE — Discharge Instructions (Signed)

## 2023-08-02 NOTE — MAU Note (Signed)
 Angela Hammond is a 28 y.o. at [redacted]w[redacted]d here in MAU reporting: been experiencing some really bad nausea, HA, has had bleeding off and on, had back ache yesterday. Supposed to see OB on 8/1. At times is afraid to eat, appetite comes and goes.  Has been stressing.  Took some Tylenol , helps.  Onset of complaint: bleeding started last wk.  Pain score: HA- slight, mild cramping, back hurting a little..... mostly it's her head.  Vitals:   08/02/23 1218  BP: 129/76  Pulse: 79  Resp: 18  Temp: 98.7 F (37.1 C)     Lab orders placed from triage:     HCG 5/15  195 5/17  63 5/26  2 6/26 2911

## 2023-08-02 NOTE — MAU Provider Note (Signed)
 S [name] is a 28 y.o. [phone] patient who presents to MAU today with complaint of a headache, bleeding that has been on and off, with lower back pain.  She is scheduled to see OB on 8/125.  She believes she is about 8 weeks 6 days.  Patient states she had a recent miscarriage in May and hCG level was at 2 on 06/10/2023 status post miscarriage.  Patient reports she was not using any contraception and it appears that she conceived immediately following her spontaneous abortion.  Patient is also complaining of a headache that has not been resolved with Tylenol .  This pregnancy has yet to be confirmed by ultrasound therefore we will go ahead and order labs with ultrasound to confirm an IUP.   O BP 126/70   Pulse 76   Temp 98.7 F (37.1 C) (Oral)   Resp 18   Ht 5' 3 (1.6 m)   Wt 109.5 kg   LMP  (LMP Unknown) Comment: had miscarriage mid May, no period after  BMI 42.78 kg/m  Physical Exam Vitals and nursing note reviewed.  Constitutional:      General: She is not in acute distress.    Appearance: Normal appearance. She is obese. She is not ill-appearing.  HENT:     Head: Normocephalic.     Nose: Nose normal.     Mouth/Throat:     Mouth: Mucous membranes are moist.  Cardiovascular:     Rate and Rhythm: Normal rate.  Pulmonary:     Effort: Pulmonary effort is normal.  Musculoskeletal:        General: Normal range of motion.     Cervical back: Normal range of motion.  Skin:    General: Skin is warm.  Neurological:     Mental Status: She is alert and oriented to person, place, and time.  Psychiatric:        Mood and Affect: Mood normal.        Behavior: Behavior normal.     MDM  HIGH  Headache  Vaginal bleeding in early pregnancy  CBC: unremarkable HCG Quant: 101,166 ABO: A POS OB Ultrasound ( C/W  IUP with a small Subchorionic hematoma ~ [redacted]w[redacted]d) Vaginal Swabs ( C/W BV - will plan to give Rx for Flagyl   at discharge) UA: unremarkable  Excedrin given for HA  ( Relief noted and resolved at discharge)  Differential diagnosis considered for 1st trimester vaginal bleeding includes but is not limited to: ectopic pregnancy, complete spontaneous abortion, incomplete abortion, missed abortion, threatened abortion, embryonic/fetal demise, cervical insufficiency, cervical or vaginal disorder     Orders Placed This Encounter  Procedures   Wet prep, genital    Standing Status:   Standing    Number of Occurrences:   1   US  OB Transvaginal    Standing Status:   Standing    Number of Occurrences:   1    Symptom/Reason for Exam:   Vaginal bleeding [[id number]]   Urinalysis, Routine w reflex microscopic -Urine, Clean Catch    Standing Status:   Standing    Number of Occurrences:   1    Specimen Source:   Urine, Clean Catch [76]   hCG, quantitative, pregnancy    Standing Status:   Standing    Number of Occurrences:   1   CBC    Standing Status:   Standing    Number of Occurrences:   1   Discharge patient Discharge disposition: 01-Home or Self Care;  Discharge patient date: 08/02/2023    Standing Status:   Standing    Number of Occurrences:   1    Discharge disposition:   01-Home or Self Care [1]    Discharge patient date:   08/02/2023      Results for orders placed or performed during the hospital encounter of 08/02/23 (from the past 24 hours)  Urinalysis, Routine w reflex microscopic -Urine, Clean Catch     Status: Abnormal   Collection Time: 08/02/23 12:58 PM  Result Value Ref Range   Color, Urine YELLOW YELLOW   APPearance HAZY (A) CLEAR   Specific Gravity, Urine 1.018 1.005 - 1.030   pH 6.0 5.0 - 8.0   Glucose, UA NEGATIVE NEGATIVE mg/dL   Hgb urine dipstick NEGATIVE NEGATIVE   Bilirubin Urine NEGATIVE NEGATIVE   Ketones, ur NEGATIVE NEGATIVE mg/dL   Protein, ur NEGATIVE NEGATIVE mg/dL   Nitrite NEGATIVE NEGATIVE   Leukocytes,Ua TRACE (A) NEGATIVE   RBC / HPF 0-5 0 - 5 RBC/hpf   WBC, UA 0-5 0 - 5 WBC/hpf   Bacteria, UA NONE SEEN NONE SEEN    Squamous Epithelial / HPF 0-5 0 - 5 /HPF  Wet prep, genital     Status: Abnormal   Collection Time: 08/02/23 12:58 PM   Specimen: PATH Cytology Cervicovaginal Ancillary Only  Result Value Ref Range   Yeast Wet Prep HPF POC NONE SEEN NONE SEEN   Trich, Wet Prep NONE SEEN NONE SEEN   Clue Cells Wet Prep HPF POC PRESENT (A) NONE SEEN   WBC, Wet Prep HPF POC >=10 (A) <10   Sperm NONE SEEN   hCG, quantitative, pregnancy     Status: Abnormal   Collection Time: 08/02/23  1:01 PM  Result Value Ref Range   hCG, Beta Chain, Quant, S 101,166 (H) <5 mIU/mL  CBC     Status: Abnormal   Collection Time: 08/02/23  1:01 PM  Result Value Ref Range   WBC 3.5 (L) 4.0 - 10.5 K/uL   RBC 4.59 3.87 - 5.11 MIL/uL   Hemoglobin 11.8 (L) 12.0 - 15.0 g/dL   HCT 63.2 63.9 - 53.9 %   MCV 80.0 80.0 - 100.0 fL   MCH 25.7 (L) 26.0 - 34.0 pg   MCHC 32.2 30.0 - 36.0 g/dL   RDW 84.1 (H) 88.4 - 84.4 %   Platelets 181 150 - 400 K/uL   nRBC 0.0 0.0 - 0.2 %      Study Result  Narrative & Impression  CLINICAL DATA:  Pregnant patient with pain and vaginal bleeding. Unknown LMP.   EXAM: TRANSVAGINAL OB ULTRASOUND   TECHNIQUE: Transvaginal ultrasound was performed for complete evaluation of the gestation as well as the maternal uterus, adnexal regions, and pelvic cul-de-sac.   COMPARISON:  06/27/2023   FINDINGS: Intrauterine gestational sac: Single   Yolk sac:  Visualized.   Embryo:  Visualized.   Cardiac Activity: Visualized.   Heart Rate: 161 bpm   CRL:   14.7 mm   7 w 6 d                  US  EDC: 03/14/2024   Subchorionic hemorrhage:  Small.   Maternal uterus/adnexae: The left ovary is visualized and normal. The right ovary is not seen. No adnexal mass. Trace free fluid in the pelvis appears simple.   IMPRESSION: Single live intrauterine pregnancy estimated gestational age based on crown-rump length 7 weeks 6 days for ultrasound [facility] 03/14/2024. Small subchorionic hemorrhage.  Electronically Signed   By: Andrea Gasman M.D.   On: 08/02/2023 14:12     I have reviewed the patient chart and performed the physical exam . I have ordered & interpreted the lab results and reviewed and interpreted the ultrasound images and agree with the radiology report  Medications ordered as stated below.  A/P as described below.  Counseling and education provided and patient agreeable  with plan as described below. Verbalized understanding.    ASSESSMENT Medical screening exam complete  1. Normal intrauterine pregnancy on prenatal ultrasound in first trimester (Primary)  2. Vaginal bleeding affecting early pregnancy  3. Nonintractable headache, unspecified chronicity pattern, unspecified headache type  4. Subchorionic hematoma in first trimester, single or unspecified fetus  5. Bacterial vaginosis    PLAN Future Appointments  Date Time Provider Department Center  08/06/2023 10:15 AM [facility] INTAKE [facility]  08/16/2023  9:15 AM Zina Jerilynn LABOR, MD [facility]    Discharge from MAU in stable condition  See AVS for full description of educational information and instructions provided to the patient at time of discharge   Warning signs for worsening condition that would warrant emergency follow-up discussed Patient may return to MAU as needed   Littie Olam LABOR, NP 08/02/2023 3:34 PM

## 2023-08-05 LAB — GC/CHLAMYDIA PROBE AMP (~~LOC~~) NOT AT ARMC
Chlamydia: NEGATIVE
Comment: NEGATIVE
Comment: NORMAL
Neisseria Gonorrhea: NEGATIVE

## 2023-08-05 NOTE — Progress Notes (Signed)
 New OB Intake  I connected with Angela Hammond  on 08/06/23 at 10:15 AM EDT by MyChart Video Visit and verified that I am speaking with the correct person using two identifiers. Nurse is located at [facility] and pt is located at home.  I discussed the limitations, risks, security and privacy concerns of performing an evaluation and management service by telephone and the availability of in person appointments. I also discussed with the patient that there may be a patient responsible charge related to this service. The patient expressed understanding and agreed to proceed.  I explained I am completing New OB Intake today. We discussed EDD of 03/15/23 based on US  at 7.6 weeks. Pt is G4P0020. I reviewed her allergies, medications and Medical/Surgical/OB history.    Patient Active Problem List   Diagnosis Date Noted   Supervision of high risk pregnancy, antepartum 08/06/2023   Alpha thalassemia silent carrier 08/08/2021   Genetic carrier - at risk of SMA carrier 08/08/2021   Morbid obesity (HCC) 04/18/2011     Concerns addressed today  Delivery Plans Plans to deliver at [facility]. Discussed the nature of our practice with multiple providers including residents and students. Due to the size of the practice, the delivering provider may not be the same as those providing prenatal care.   Patient is not interested in water birth.  MyChart/Babyscripts MyChart access verified. I explained pt will have some visits in office and some virtually. Babyscripts instructions given and order placed. Patient verifies receipt of registration text/e-mail. Account successfully created and app downloaded. If patient is a candidate for Optimized scheduling, add to sticky note.   Blood Pressure Cuff/Weight Scale Blood pressure cuff ordered for patient to pick-up from [employer]. Explained after first prenatal appt pt will check weekly and document in Babyscripts.  Anatomy US  Explained first scheduled US  will  be around 19 weeks. Anatomy US  scheduled for 10/22/2023 at 8:00am.  Is patient a CenteringPregnancy candidate?  Declined Declined due to Group setting   Is patient a Mom+Baby Combined Care candidate?  Not a candidate   If accepted, confirm patient does not intend to move from the area for at least 12 months, then notify Mom+Baby staff  First visit review I reviewed new OB appt with patient. Explained pt will be seen by Dr. Zina at first visit. Discussed [place] genetic screening with patient. [employer] and [employer].. Routine prenatal labs is needed at new ob appointment.  Last Pap No results found for: DIAGPAP  Angela Hammond, CMA 08/06/2023  10:20 AM

## 2023-08-06 ENCOUNTER — Telehealth

## 2023-08-06 DIAGNOSIS — Z3A08 8 weeks gestation of pregnancy: Secondary | ICD-10-CM | POA: Diagnosis not present

## 2023-08-06 DIAGNOSIS — O0991 Supervision of high risk pregnancy, unspecified, first trimester: Secondary | ICD-10-CM

## 2023-08-06 DIAGNOSIS — O0990 Supervision of high risk pregnancy, unspecified, unspecified trimester: Secondary | ICD-10-CM | POA: Insufficient documentation

## 2023-08-06 MED ORDER — BLOOD PRESSURE KIT DEVI: 1.0000 | 0 refills | Status: AC | PRN

## 2023-08-06 NOTE — Patient Instructions (Signed)

## 2023-08-16 ENCOUNTER — Other Ambulatory Visit: Payer: Self-pay | Admitting: Medical Genetics

## 2023-08-16 ENCOUNTER — Ambulatory Visit (INDEPENDENT_AMBULATORY_CARE_PROVIDER_SITE_OTHER): Admitting: Obstetrics and Gynecology

## 2023-08-16 ENCOUNTER — Other Ambulatory Visit (HOSPITAL_COMMUNITY)
Admission: RE | Admit: 2023-08-16 | Discharge: 2023-08-16 | Disposition: A | Discharge status: Home / Self Care | Source: Ambulatory Visit | Attending: Obstetrics and Gynecology | Admitting: Obstetrics and Gynecology

## 2023-08-16 ENCOUNTER — Other Ambulatory Visit: Payer: Self-pay

## 2023-08-16 ENCOUNTER — Encounter: Payer: Self-pay | Admitting: Obstetrics and Gynecology

## 2023-08-16 VITALS — BP 129/82 | HR 94 | Wt 237.5 lb

## 2023-08-16 DIAGNOSIS — Z148 Genetic carrier of other disease: Secondary | ICD-10-CM | POA: Diagnosis not present

## 2023-08-16 DIAGNOSIS — O0990 Supervision of high risk pregnancy, unspecified, unspecified trimester: Secondary | ICD-10-CM | POA: Diagnosis present

## 2023-08-16 DIAGNOSIS — Z3A09 9 weeks gestation of pregnancy: Secondary | ICD-10-CM

## 2023-08-16 DIAGNOSIS — O09291 Supervision of pregnancy with other poor reproductive or obstetric history, first trimester: Secondary | ICD-10-CM

## 2023-08-16 DIAGNOSIS — D563 Thalassemia minor: Secondary | ICD-10-CM

## 2023-08-16 DIAGNOSIS — O0991 Supervision of high risk pregnancy, unspecified, first trimester: Secondary | ICD-10-CM

## 2023-08-16 DIAGNOSIS — O09299 Supervision of pregnancy with other poor reproductive or obstetric history, unspecified trimester: Secondary | ICD-10-CM | POA: Insufficient documentation

## 2023-08-16 DIAGNOSIS — Z1332 Encounter for screening for maternal depression: Secondary | ICD-10-CM

## 2023-08-16 NOTE — Progress Notes (Signed)
 INITIAL PRENATAL VISIT NOTE  Subjective:  Angela Hammond is a 28 y.o. G4P0020 at [redacted]w[redacted]d by ultrasound being seen today for her initial prenatal visit.  She has an obstetric history significant for multiple SAB including second trimester loss with incompetent cervix. She has a medical history significant for PID and fibroids.  Patient reports no complaints.   . Vag. Bleeding: Scant.   . Denies leaking of fluid.    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   Pelvic inflammatory disease (PID) 04/18/2011   Preterm labor in second trimester 08/31/2021   Previable preterm premature rupture of membranes at [redacted] weeks gestation 07/08/2020    Past Surgical History:  Procedure Laterality Date   TONSILLECTOMY     AGE 31    OB History  Gravida Para Term Preterm AB Living  4 1 0 0 2 0  SAB IAB Ectopic Multiple Live Births  2 0 0 0 0    # Outcome Date GA Lbr Len/2nd Weight Sex Type Anes PTL Lv  4 Current           3 SAB 06/01/23 [redacted]w[redacted]d         2 SAB 2023 [redacted]w[redacted]d    SAB     1 Para 07/08/20 [redacted]w[redacted]d / 00:06 6.9 oz (0.196 kg) M Vag-Breech None  FD    Social History   Socioeconomic History   Marital status: Single    Spouse name: Not on file   Number of children: Not on file   Years of education: Not on file   Highest education level: Not on file  Occupational History   Not on file  Tobacco Use   Smoking status: Never   Smokeless tobacco: Never   Tobacco comments:    Black & mild  Vaping Use   Vaping status: Never Used  Substance and Sexual Activity   Alcohol use: Yes    Comment: little   Drug use: No   Sexual activity: Not Currently    Partners: Male    Birth control/protection: None     Comment: pregnant  Other Topics Concern   Not on file  Social History Narrative   Not on file   Social Drivers of Health   Financial Resource Strain: Not on file  Food Insecurity: Medium Risk (06/20/2022)   Received from [facility]   Hunger Vital Sign    Within the past 12 months, you worried that your food would run out before you got money to buy more: Sometimes true    Within the past 12 months, the food you bought just didn't last and you didn't have money to get more. : Sometimes true  Transportation Needs: No Transportation Needs (06/20/2022)   Received from [facility]    In the past 12 months, has lack of reliable transportation kept you from medical appointments, meetings, work or from getting things needed for daily living? : No  Physical Activity: Not on file  Stress: Not on file  Social Connections: Not on file    Family History  Problem Relation Age of Onset   Stroke Mother  Hypertension Mother    Hypertension Father    Heart failure Father    Diabetes Maternal Aunt    Stroke Maternal Aunt    Hypertension Maternal Aunt    Stroke Maternal Grandfather    Hypertension Maternal Grandfather      Current Outpatient Medications:    acetaminophen  (TYLENOL ) 500 MG tablet, Take 500 mg by mouth every 6 (six) hours as needed., Disp: , Rfl:    Blood Pressure Monitoring (BLOOD PRESSURE KIT) DEVI, 1 each by Does not apply route as needed., Disp: 1 each, Rfl: 0   acetaminophen -caffeine  (EXCEDRIN TENSION HEADACHE) 500-65 MG TABS per tablet, Take 2 tablets by mouth 4 (four) times daily as needed (Foe headaches). (Patient not taking: Reported on 08/16/2023), Disp: 60 tablet, Rfl: 0   ondansetron  (ZOFRAN ) 4 MG tablet, Take 2 tablets (8 mg total) by mouth 2 (two) times daily. (Patient not taking: Reported on 08/16/2023), Disp: 20 tablet, Rfl: 0   Prenatal Vit-Fe Fumarate-FA (PRENATAL COMPLETE) 14-0.4 MG TABS, Take 1 tablet by mouth daily. (Patient not taking:  Reported on 08/16/2023), Disp: 60 tablet, Rfl: 3  No Known Allergies  Review of Systems: Negative except for what is mentioned in HPI.  Objective:   Vitals:   08/16/23 0937  BP: 129/82  Pulse: 94  Weight: 237 lb 8 oz (107.7 kg)    Fetal Status:           Physical Exam: BP 129/82   Pulse 94   Wt 237 lb 8 oz (107.7 kg)   LMP  (LMP Unknown) Comment: had miscarriage mid May, no period after  BMI 42.07 kg/m  CONSTITUTIONAL: Well-developed, well-nourished female in no acute distress.  NEUROLOGIC: Alert and oriented to person, place, and time. Normal reflexes, muscle tone coordination. No cranial nerve deficit noted. PSYCHIATRIC: Normal mood and affect. Normal behavior. Normal judgment and thought content. SKIN: Skin is warm and dry. No rash noted. Not diaphoretic. No erythema. No pallor. HENT:  Normocephalic, atraumatic, External right and left ear normal. Oropharynx is clear and moist EYES: Conjunctivae and EOM are normal.  NECK: Normal range of motion, supple, no masses CARDIOVASCULAR: Normal heart rate noted, regular rhythm RESPIRATORY: Effort and breath sounds normal, no problems with respiration noted BREASTS: deferred ABDOMEN: Soft, nontender, nondistended, gravid. GU: normal appearing external female genitalia, multiparous, normal appearing cervix, slightly friable, scant white discharge in vagina, no lesions noted Bimanual: 9 weeks sized uterus, no adnexal tenderness or palpable lesions noted MUSCULOSKELETAL: Normal range of motion. EXT:  No edema and no tenderness. 2+ distal pulses.  Live IUP noted on bedside ultrasound Assessment and Plan:  Pregnancy: G4P0020 at [redacted]w[redacted]d by ultrasound  1. Supervision of high risk pregnancy, antepartum (Primary) Continue routine prenatal care  - Cytology - PAP( [place]) - Urine Culture - CBC/D/Plt+RPR+Rh+ABO+RubIgG... - GC/Chlamydia probe amp ([place])not at [facility] - Hemoglobin A1c - Cervicovaginal ancillary only  2. Genetic  carrier - at risk of SMA carrier   3. Alpha thalassemia silent carrier   4. [redacted] weeks gestation of pregnancy   5. History of incompetent cervix, currently pregnant Discussed sequelae of incompetent cervix with standard of care to place cerclage. Pt adamantly refuses cerclage.  MD explained much high risk of similar outcome for second trimester loss or late or complications due to preterm labor or PROM.  Pt again declines even with knowing possible bad outcomes.   Preterm labor symptoms and general obstetric precautions including but not limited to vaginal bleeding, contractions, leaking of fluid and fetal  movement were reviewed in detail with the patient.  Please refer to After Visit Summary for other counseling recommendations.   Return in about 4 weeks (around 09/13/2023) for ROB, in person.  Jerilynn DELENA Buddle 08/16/2023 10:12 AM

## 2023-08-17 LAB — CBC/D/PLT+RPR+RH+ABO+RUBIGG...
Antibody Screen: NEGATIVE
Basophils Absolute: 0 x10E3/uL (ref 0.0–0.2)
Basos: 0 %
EOS (ABSOLUTE): 0.1 x10E3/uL (ref 0.0–0.4)
Eos: 1 %
HCV Ab: NONREACTIVE
HIV Screen 4th Generation wRfx: NONREACTIVE
Hematocrit: 39.3 % (ref 34.0–46.6)
Hemoglobin: 12 g/dL (ref 11.1–15.9)
Hepatitis B Surface Ag: NEGATIVE
Immature Grans (Abs): 0 x10E3/uL (ref 0.0–0.1)
Immature Granulocytes: 0 %
Lymphocytes Absolute: 1.6 x10E3/uL (ref 0.7–3.1)
Lymphs: 32 %
MCH: 25.1 pg — ABNORMAL LOW (ref 26.6–33.0)
MCHC: 30.5 g/dL — ABNORMAL LOW (ref 31.5–35.7)
MCV: 82 fL (ref 79–97)
Monocytes Absolute: 0.6 x10E3/uL (ref 0.1–0.9)
Monocytes: 12 %
Neutrophils Absolute: 2.8 x10E3/uL (ref 1.4–7.0)
Neutrophils: 55 %
Platelets: 229 x10E3/uL (ref 150–450)
RBC: 4.79 x10E6/uL (ref 3.77–5.28)
RDW: 15.1 % (ref 11.7–15.4)
RPR Ser Ql: NONREACTIVE
Rh Factor: POSITIVE
Rubella Antibodies, IGG: 4.69 {index} (ref >0.99)
WBC: 5.2 x10E3/uL (ref 3.4–10.8)

## 2023-08-17 LAB — HEMOGLOBIN A1C
Est. average glucose Bld gHb Est-mCnc: 111 mg/dL
Hgb A1c MFr Bld: 5.5 % (ref 4.8–5.6)

## 2023-08-17 LAB — HCV INTERPRETATION

## 2023-08-18 LAB — URINE CULTURE

## 2023-08-19 ENCOUNTER — Ambulatory Visit: Payer: Self-pay | Admitting: Obstetrics and Gynecology

## 2023-08-20 ENCOUNTER — Other Ambulatory Visit

## 2023-08-20 LAB — CERVICOVAGINAL ANCILLARY ONLY
Chlamydia: NEGATIVE
Comment: NEGATIVE
Comment: NEGATIVE
Comment: NORMAL
Neisseria Gonorrhea: NEGATIVE
Trichomonas: NEGATIVE

## 2023-08-21 LAB — CYTOLOGY - PAP: Diagnosis: NEGATIVE

## 2023-09-13 ENCOUNTER — Other Ambulatory Visit: Payer: Self-pay

## 2023-09-13 ENCOUNTER — Ambulatory Visit (INDEPENDENT_AMBULATORY_CARE_PROVIDER_SITE_OTHER): Admitting: Obstetrics & Gynecology

## 2023-09-13 ENCOUNTER — Encounter: Payer: Self-pay | Admitting: Obstetrics & Gynecology

## 2023-09-13 VITALS — BP 124/66 | HR 88 | Wt 244.6 lb

## 2023-09-13 DIAGNOSIS — Z148 Genetic carrier of other disease: Secondary | ICD-10-CM

## 2023-09-13 DIAGNOSIS — Z31430 Encounter of female for testing for genetic disease carrier status for procreative management: Secondary | ICD-10-CM

## 2023-09-13 DIAGNOSIS — O09291 Supervision of pregnancy with other poor reproductive or obstetric history, first trimester: Secondary | ICD-10-CM | POA: Diagnosis not present

## 2023-09-13 DIAGNOSIS — Z3A13 13 weeks gestation of pregnancy: Secondary | ICD-10-CM

## 2023-09-13 DIAGNOSIS — O09299 Supervision of pregnancy with other poor reproductive or obstetric history, unspecified trimester: Secondary | ICD-10-CM

## 2023-09-13 DIAGNOSIS — O0991 Supervision of high risk pregnancy, unspecified, first trimester: Secondary | ICD-10-CM | POA: Diagnosis not present

## 2023-09-13 DIAGNOSIS — O0990 Supervision of high risk pregnancy, unspecified, unspecified trimester: Secondary | ICD-10-CM

## 2023-09-13 DIAGNOSIS — Z3481 Encounter for supervision of other normal pregnancy, first trimester: Secondary | ICD-10-CM

## 2023-09-13 NOTE — Progress Notes (Signed)
   PRENATAL VISIT NOTE  Subjective:  Angela Hammond is a 28 y.o. G4P0030at [redacted]w[redacted]d being seen today for ongoing prenatal care.  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  Patient reports no complaints.  Contractions: Not present. Vag. Bleeding: None.   . Denies leaking of fluid.   The following portions of the patient's history were reviewed and updated as appropriate: allergies, current medications, past family history, past medical history, past social history, past surgical history and problem list.   Objective:    Vitals:   09/13/23 0837  BP: 124/66  Pulse: 88  Weight: 244 lb 9.6 oz (110.9 kg)    Fetal Status:  Fetal Heart Rate (bpm): 157        General: Alert, oriented and cooperative. Patient is in no acute distress.  Skin: Skin is warm and dry. No rash noted.   Cardiovascular: Normal heart rate noted  Respiratory: Normal respiratory effort, no problems with respiration noted  Abdomen: Soft, gravid, appropriate for gestational age.  Pain/Pressure: Absent     Pelvic: Cervical exam deferred        Extremities: Normal range of motion.  Edema: None  Mental Status: Normal mood and affect. Normal behavior. Normal judgment and thought content.   Assessment and Plan:  Pregnancy: G4P0020 at [redacted]w[redacted]d 1. History of incompetent cervix, currently pregnant (Primary) Needs f/u MFM recommend cerclage  2. Supervision of high risk pregnancy, antepartum obesity - TSH  3. 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 Morbid obesity (HCC)  - TSH  Preterm labor symptoms and general obstetric precautions including but not limited to vaginal bleeding, contractions, leaking of fluid and fetal movement were reviewed in detail  with the patient. Please refer to After Visit Summary for other counseling recommendations.   Return in about 4 weeks (around 10/11/2023).  Future Appointments  Date Time Provider Department Center  10/09/2023 10:15 AM Delores Nidia CROME, FNP [facility]  10/22/2023  8:00 AM [facility] PROVIDER 1 [facility]  10/22/2023  8:30 AM [facility] US3 [facility]  10/30/2023 10:55 AM Fredirick Glenys RAMAN, MD [facility]  11/20/2023 10:15 AM [facility]-GENERAL 2 [facility]  12/04/2023  8:15 AM [facility]GENERAL 2 [facility]  12/04/2023  8:40 AM [facility] LAB [facility]    Lynwood Solomons, MD

## 2023-09-14 LAB — TSH: TSH: 1.72 u[IU]/mL (ref 0.450–4.500)

## 2023-09-22 LAB — PANORAMA PRENATAL TEST FULL PANEL:PANORAMA TEST PLUS 5 ADDITIONAL MICRODELETIONS: FETAL FRACTION: 6.3

## 2023-09-24 LAB — HORIZON CUSTOM: REPORT SUMMARY: POSITIVE — AB

## 2023-10-09 ENCOUNTER — Ambulatory Visit (INDEPENDENT_AMBULATORY_CARE_PROVIDER_SITE_OTHER): Admitting: Obstetrics and Gynecology

## 2023-10-09 ENCOUNTER — Other Ambulatory Visit: Payer: Self-pay

## 2023-10-09 VITALS — BP 115/70 | HR 78 | Wt 252.5 lb

## 2023-10-09 DIAGNOSIS — Z3A17 17 weeks gestation of pregnancy: Secondary | ICD-10-CM

## 2023-10-09 DIAGNOSIS — O0990 Supervision of high risk pregnancy, unspecified, unspecified trimester: Secondary | ICD-10-CM

## 2023-10-09 DIAGNOSIS — Z23 Encounter for immunization: Secondary | ICD-10-CM | POA: Diagnosis not present

## 2023-10-09 DIAGNOSIS — O09292 Supervision of pregnancy with other poor reproductive or obstetric history, second trimester: Secondary | ICD-10-CM | POA: Diagnosis not present

## 2023-10-09 DIAGNOSIS — Z8759 Personal history of other complications of pregnancy, childbirth and the puerperium: Secondary | ICD-10-CM

## 2023-10-09 DIAGNOSIS — O09299 Supervision of pregnancy with other poor reproductive or obstetric history, unspecified trimester: Secondary | ICD-10-CM

## 2023-10-09 DIAGNOSIS — O0992 Supervision of high risk pregnancy, unspecified, second trimester: Secondary | ICD-10-CM

## 2023-10-09 MED ORDER — PROGESTERONE 200 MG VA SUPP
200.0000 mg | Freq: Every day | VAGINAL | 3 refills | Status: AC
Start: 2023-10-09 — End: ?

## 2023-10-09 MED ORDER — VITAFOL GUMMIES 3.33-0.333-34.8 MG PO CHEW: 3.0000 | CHEWABLE_TABLET | Freq: Every day | ORAL | 5 refills | Status: AC

## 2023-10-09 NOTE — Progress Notes (Signed)
   PRENATAL VISIT NOTE  Subjective:  Angela Hammond is a 28 y.o. G4P0020 at [redacted]w[redacted]d being seen today for ongoing prenatal care.  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  Patient reports asking about other option for cervix .   . Vag. Bleeding: None.   . Denies leaking of fluid.   The following portions of the patient's history were reviewed and updated as appropriate: allergies, current medications, past family history, past medical history, past social history, past surgical history and problem list.   Objective:    Vitals:   10/09/23 1022  BP: 115/70  Pulse: 78  Weight: 252 lb 8 oz (114.5 kg)    Fetal Status:  Fetal Heart Rate (bpm): 158        General: Alert, oriented and cooperative. Patient is in no acute distress.  Skin: Skin is warm and dry. No rash noted.   Cardiovascular: Normal heart rate noted  Respiratory: Normal respiratory effort, no problems with respiration noted  Abdomen: Soft, gravid, appropriate for gestational age.  Pain/Pressure: Absent     Pelvic: Cervical exam deferred        Extremities: Normal range of motion.     Mental Status: Normal mood and affect. Normal behavior. Normal judgment and thought content.   Assessment and Plan:  Pregnancy: G4P0020 at [redacted]w[redacted]d 1. Supervision of high risk pregnancy, antepartum (Primary) BP and FHR normal   - AFP, Serum, Open Spina Bifida - Flu vaccine trivalent PF, 6mos and older(Flulaval,Afluria,Fluarix,Fluzone) - Prenatal Vit-Fe Phos-FA-Omega (VITAFOL  GUMMIES) 3.33-0.333-34.8 MG CHEW; Chew 3 tablets by mouth daily.  Dispense: 90 tablet; Refill: 5  2. [redacted] weeks gestation of pregnancy Discussed AFP, getting today  - AFP, Serum, Open Spina Bifida  3.  History of miscarriage 4. History of incompetent cervix, currently pregnant U/s on 10/7 She does not desire cerclage, she understands the risk. She asked about trial of progesterone , discussed not as beneficial as cerclage, but strongly desires trial of this instead of cerclage. Discussed with provider in office - progesterone  200 MG SUPP; Place 1 suppository (200 mg total) vaginally at bedtime.  Dispense: 30 suppository; Refill: 3    Preterm labor symptoms and general obstetric precautions including but not limited to vaginal bleeding, contractions, leaking of fluid and fetal movement were reviewed in detail with the patient. Please refer to After Visit Summary for other counseling recommendations.     Nidia Daring, FNP

## 2023-10-10 ENCOUNTER — Telehealth: Payer: Self-pay | Admitting: Family Medicine

## 2023-10-10 ENCOUNTER — Other Ambulatory Visit: Payer: Self-pay | Admitting: Obstetrics and Gynecology

## 2023-10-10 DIAGNOSIS — O09299 Supervision of pregnancy with other poor reproductive or obstetric history, unspecified trimester: Secondary | ICD-10-CM

## 2023-10-10 MED ORDER — PROGESTERONE MICRONIZED 200 MG PO CAPS
200.0000 mg | ORAL_CAPSULE | Freq: Every day | ORAL | 5 refills | Status: DC
Start: 2023-10-10 — End: 2023-10-30

## 2023-10-10 MED ORDER — PROGESTERONE MICRONIZED 200 MG PO CAPS: 200.0000 mg | ORAL_CAPSULE | Freq: Every day | ORAL | 6 refills | Status: DC

## 2023-10-10 NOTE — Telephone Encounter (Signed)
 RN attempted to call pt back about her medication concerns.  Left voicemail with office call back number.    Waddell, RN

## 2023-10-10 NOTE — Telephone Encounter (Signed)
 Received a call from [employer] that the medication that was sent for this patient (progesterone  200 MG)  is not covered by her insurance so she will need to be prescribed something else.

## 2023-10-10 NOTE — Telephone Encounter (Signed)
 Patient was seen yesterday and is saying the wrong prescription were probably prescribed. She would like to speak to a nurse regarding this matter ASAP. Patient is not happy about the situation.

## 2023-10-11 LAB — AFP, SERUM, OPEN SPINA BIFIDA
AFP MoM: 0.74
AFP Value: 22.8 ng/mL
Gest. Age on Collection Date: 17 wk
Maternal Age At EDD: 30.3 a
OSBR Risk 1 IN: 10000
Test Results:: NEGATIVE
Weight: 253 [lb_av]

## 2023-10-11 NOTE — Telephone Encounter (Signed)
 RN attempted to call pt to advise her of new prescription instructions per Delores, NP.  Progesterone  capsules were sent to pt pharmacy, she should only insert 1 capsule vaginally nightly at bed time, not orally.  Left voicemail with office call back number.  MyChart message sent as well.   Waddell, RN

## 2023-10-11 NOTE — Telephone Encounter (Signed)
 Pt returned RN's call.  Advised pt of new progesterone  prescriptions and instructions to place vaginally, nightly.  Pt verbalized understanding and had no further questions at this time.    Waddell, RN

## 2023-10-11 NOTE — Telephone Encounter (Signed)
 RN contacted [facility] to notify them of prescription instruction adjustment.  Pt should place one capsule vaginally at bed time, nightly.  Pharmacy tech report they have correct vaginal administration instructions.    Waddell, RN

## 2023-10-13 ENCOUNTER — Ambulatory Visit: Payer: Self-pay | Admitting: Obstetrics and Gynecology

## 2023-10-13 DIAGNOSIS — O0990 Supervision of high risk pregnancy, unspecified, unspecified trimester: Secondary | ICD-10-CM

## 2023-10-21 ENCOUNTER — Encounter: Payer: Self-pay | Admitting: Family Medicine

## 2023-10-22 ENCOUNTER — Other Ambulatory Visit: Payer: Self-pay | Admitting: Obstetrics and Gynecology

## 2023-10-22 ENCOUNTER — Ambulatory Visit: Attending: Obstetrics and Gynecology | Admitting: Obstetrics

## 2023-10-22 ENCOUNTER — Ambulatory Visit (HOSPITAL_BASED_OUTPATIENT_CLINIC_OR_DEPARTMENT_OTHER)

## 2023-10-22 ENCOUNTER — Other Ambulatory Visit: Payer: Self-pay | Admitting: *Deleted

## 2023-10-22 VITALS — BP 97/63

## 2023-10-22 DIAGNOSIS — Z79890 Hormone replacement therapy (postmenopausal): Secondary | ICD-10-CM | POA: Diagnosis not present

## 2023-10-22 DIAGNOSIS — O365920 Maternal care for other known or suspected poor fetal growth, second trimester, not applicable or unspecified: Secondary | ICD-10-CM | POA: Insufficient documentation

## 2023-10-22 DIAGNOSIS — Z3A19 19 weeks gestation of pregnancy: Secondary | ICD-10-CM | POA: Diagnosis not present

## 2023-10-22 DIAGNOSIS — O2622 Pregnancy care for patient with recurrent pregnancy loss, second trimester: Secondary | ICD-10-CM | POA: Insufficient documentation

## 2023-10-22 DIAGNOSIS — O3432 Maternal care for cervical incompetence, second trimester: Secondary | ICD-10-CM | POA: Diagnosis not present

## 2023-10-22 DIAGNOSIS — Z148 Genetic carrier of other disease: Secondary | ICD-10-CM | POA: Insufficient documentation

## 2023-10-22 DIAGNOSIS — O09292 Supervision of pregnancy with other poor reproductive or obstetric history, second trimester: Secondary | ICD-10-CM

## 2023-10-22 DIAGNOSIS — O358XX0 Maternal care for other (suspected) fetal abnormality and damage, not applicable or unspecified: Secondary | ICD-10-CM

## 2023-10-22 DIAGNOSIS — O26872 Cervical shortening, second trimester: Secondary | ICD-10-CM

## 2023-10-22 DIAGNOSIS — O99212 Obesity complicating pregnancy, second trimester: Secondary | ICD-10-CM | POA: Insufficient documentation

## 2023-10-22 DIAGNOSIS — Z3686 Encounter for antenatal screening for cervical length: Secondary | ICD-10-CM | POA: Insufficient documentation

## 2023-10-22 DIAGNOSIS — N883 Incompetence of cervix uteri: Secondary | ICD-10-CM

## 2023-10-22 DIAGNOSIS — E669 Obesity, unspecified: Secondary | ICD-10-CM | POA: Diagnosis not present

## 2023-10-22 DIAGNOSIS — O0990 Supervision of high risk pregnancy, unspecified, unspecified trimester: Secondary | ICD-10-CM

## 2023-10-22 NOTE — Progress Notes (Signed)
 MFM Consult Note  Angela Hammond is currently at 19 weeks and 3 days.  She was seen due to maternal obesity with a BMI of 42 and history of two prior losses at between 18 to 19 weeks.  The patient reports that her first loss in 2022 at 19+ weeks occurred following spontaneous rupture of membranes.  Her second loss in 2023 occurred at 18+ weeks after a dilated cervix was noted on her ultrasound exam.  The patient declined any treatment or intervention at that time.  She denies any prior surgeries to her cervix.  She denies any problems in her current pregnancy.    Due to her prior poor obstetrical history, she is currently treated with daily vaginal progesterone .  She has declined a prophylactic cerclage.  She had a cell free DNA test earlier in her pregnancy which indicated a low risk for trisomy 9, 20, and 13. A female fetus is predicted.  Sonographic findings Single intrauterine pregnancy at 19w 3d  Fetal cardiac activity:  Observed and appears normal. Presentation: Cephalic. The anatomic structures that were well seen appear normal without evidence of soft markers. Due to poor acoustic windows some structures remain suboptimally visualized. Fetal biometry shows the estimated fetal weight at the 8th percentile.  The patient's [facility] of March 14, 2024 was confirmed via first trimester ultrasound. Amniotic fluid: Within normal limits.  MVP: 7.34 cm. Placenta: Anterior Fundal. Adnexa: No abnormality visualized. Cervical length: 3.7 cm.   The patient was informed that anomalies may be missed due to technical limitations. If the fetus is in a suboptimal position or maternal habitus is increased, visualization of the fetus in the maternal uterus may be impaired.  Prior poor obstetrical history with 2 prior midtrimester losses The patient was advised that based on her prior obstetrical history, a cervical cerclage may be considered to help improve her obstetrical outcome.  She declined the  cerclage today. She was advised to continue using vaginal progesterone  200 mg daily to decrease her risk of another preterm/previable birth. Her cervical length was 3.7 cm long today.  We will continue to follow her with serial transvaginal cervical lengths.   The patient understands that should progressive cervical shortening be noted during her future exams, a cervical cerclage may be recommended at that time to try to salvage the pregnancy. Preterm labor precautions were reviewed.  Obesity in pregnancy Due to maternal obesity, we will continue to follow her with growth ultrasounds throughout her pregnancy.   Weekly fetal testing should be started at 34 weeks and continued until delivery.    She will return in 2 weeks for a transvaginal cervical length measurement.    The patient stated that all of her questions were answered today.  A total of 30 minutes was spent counseling and coordinating the care for this patient.  Greater than 50% of the time was spent in direct face-to-face contact.

## 2023-10-25 ENCOUNTER — Other Ambulatory Visit: Payer: Self-pay | Admitting: Genetic Counselor

## 2023-10-25 DIAGNOSIS — Z006 Encounter for examination for normal comparison and control in clinical research program: Secondary | ICD-10-CM

## 2023-10-30 ENCOUNTER — Ambulatory Visit: Admitting: Family Medicine

## 2023-10-30 ENCOUNTER — Other Ambulatory Visit: Payer: Self-pay

## 2023-10-30 VITALS — BP 124/64 | HR 80 | Wt 263.0 lb

## 2023-10-30 DIAGNOSIS — O09292 Supervision of pregnancy with other poor reproductive or obstetric history, second trimester: Secondary | ICD-10-CM | POA: Diagnosis not present

## 2023-10-30 DIAGNOSIS — O0990 Supervision of high risk pregnancy, unspecified, unspecified trimester: Secondary | ICD-10-CM

## 2023-10-30 DIAGNOSIS — O0992 Supervision of high risk pregnancy, unspecified, second trimester: Secondary | ICD-10-CM

## 2023-10-30 DIAGNOSIS — O09299 Supervision of pregnancy with other poor reproductive or obstetric history, unspecified trimester: Secondary | ICD-10-CM

## 2023-10-30 DIAGNOSIS — Z3A20 20 weeks gestation of pregnancy: Secondary | ICD-10-CM | POA: Diagnosis not present

## 2023-10-30 DIAGNOSIS — Z148 Genetic carrier of other disease: Secondary | ICD-10-CM

## 2023-10-30 DIAGNOSIS — D563 Thalassemia minor: Secondary | ICD-10-CM

## 2023-10-30 NOTE — Progress Notes (Signed)
   PRENATAL VISIT NOTE  Subjective:  Angela Hammond is a 28 y.o. G4P0020 at [redacted]w[redacted]d being seen today for ongoing prenatal care.  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  Patient reports no complaints.  Contractions: Not present. Vag. Bleeding: None.  Movement: Present. Denies leaking of fluid.   The following portions of the patient's history were reviewed and updated as appropriate: allergies, current medications, past family history, past medical history, past social history, past surgical history and problem list.   Objective:    Vitals:   10/30/23 1129  BP: 124/64  Pulse: 80  Weight: 263 lb (119.3 kg)    Fetal Status:  Fetal Heart Rate (bpm): 146   Movement: Present    General: Alert, oriented and cooperative. Patient is in no acute distress.  Skin: Skin is warm and dry. No rash noted.   Cardiovascular: Normal heart rate noted  Respiratory: Normal respiratory effort, no problems with respiration noted  Abdomen: Soft, gravid, appropriate for gestational age.  Pain/Pressure: Absent     Pelvic: Cervical exam deferred        Extremities: Normal range of motion.  Edema: None  Mental Status: Normal mood and affect. Normal behavior. Normal judgment and thought content.   Assessment and Plan:  Pregnancy: G4P0020 at [redacted]w[redacted]d 1. [redacted] weeks gestation of pregnancy (Primary)   2. Supervision of high risk pregnancy, antepartum Continue prenatal care.   3. History of incompetent cervix, currently pregnant F/u cervical length On Prometrium  Warning signs reviewed  4. 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 Alpha thalassemia silent carrier Partner declined testing  Preterm labor symptoms  and general obstetric precautions including but not limited to vaginal bleeding, contractions, leaking of fluid and fetal movement were reviewed in detail with the patient. Please refer to After Visit Summary for other counseling recommendations.   Return in 4 weeks (on 11/27/2023).  Future Appointments  Date Time Provider Department Center  11/20/2023  8:15 AM [facility] PROVIDER 1 [facility]  11/20/2023  8:30 AM [facility] US4 [facility]  11/20/2023 10:15 AM Delores Nidia CROME, FNP [facility]  12/03/2023  9:00 AM [facility] LAB [facility]  12/03/2023  9:35 AM [place], Camie LABOR, CNM [facility]    Glenys GORMAN Birk, MD

## 2023-11-06 ENCOUNTER — Telehealth: Payer: Self-pay

## 2023-11-08 ENCOUNTER — Telehealth (HOSPITAL_COMMUNITY): Payer: Self-pay | Admitting: *Deleted

## 2023-11-08 ENCOUNTER — Other Ambulatory Visit: Payer: Self-pay | Admitting: *Deleted

## 2023-11-08 ENCOUNTER — Ambulatory Visit: Attending: Obstetrics and Gynecology

## 2023-11-08 ENCOUNTER — Ambulatory Visit (HOSPITAL_BASED_OUTPATIENT_CLINIC_OR_DEPARTMENT_OTHER): Admitting: Maternal & Fetal Medicine

## 2023-11-08 VITALS — BP 115/59 | HR 88

## 2023-11-08 DIAGNOSIS — Z3A21 21 weeks gestation of pregnancy: Secondary | ICD-10-CM | POA: Diagnosis not present

## 2023-11-08 DIAGNOSIS — O09299 Supervision of pregnancy with other poor reproductive or obstetric history, unspecified trimester: Secondary | ICD-10-CM

## 2023-11-08 DIAGNOSIS — O358XX0 Maternal care for other (suspected) fetal abnormality and damage, not applicable or unspecified: Secondary | ICD-10-CM | POA: Insufficient documentation

## 2023-11-08 DIAGNOSIS — O3432 Maternal care for cervical incompetence, second trimester: Secondary | ICD-10-CM | POA: Diagnosis not present

## 2023-11-08 DIAGNOSIS — O0990 Supervision of high risk pregnancy, unspecified, unspecified trimester: Secondary | ICD-10-CM | POA: Insufficient documentation

## 2023-11-08 DIAGNOSIS — O09212 Supervision of pregnancy with history of pre-term labor, second trimester: Secondary | ICD-10-CM | POA: Diagnosis not present

## 2023-11-08 DIAGNOSIS — O365920 Maternal care for other known or suspected poor fetal growth, second trimester, not applicable or unspecified: Secondary | ICD-10-CM | POA: Insufficient documentation

## 2023-11-08 DIAGNOSIS — E669 Obesity, unspecified: Secondary | ICD-10-CM | POA: Diagnosis not present

## 2023-11-08 DIAGNOSIS — O26872 Cervical shortening, second trimester: Secondary | ICD-10-CM | POA: Insufficient documentation

## 2023-11-08 DIAGNOSIS — O99212 Obesity complicating pregnancy, second trimester: Secondary | ICD-10-CM

## 2023-11-08 DIAGNOSIS — O09292 Supervision of pregnancy with other poor reproductive or obstetric history, second trimester: Secondary | ICD-10-CM | POA: Diagnosis present

## 2023-11-08 NOTE — Telephone Encounter (Signed)
 Preadmission screen. Message left on voicemail and instructions sent via MyChart account.

## 2023-11-08 NOTE — Progress Notes (Signed)
 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 G4P0020 at [redacted]w[redacted]d here for ultrasound and consultation. Angela Hammond is doing well today with no acute concerns. Today we focused on the following:   Cervical dilation in the setting of hx of PTBD x2: Today the cervix appears dilated on ultrasound.  I performed a speculum exam and it appears the cervix is dilated to at least 0.5 cm.  I did not perform a digital exam to avoid increasing the risk of preterm labor.  The patient denies any contractions bleeding or loss of fluid.  She denies any increasing pelvic pressure.  She had a previous cervical length that was purported to be 3.7 cm but on reviewing the images there was fluid within the cervical canal.  On today's ultrasound there is an abundance of cervical funneling to the point where the cervix does not appear closed.  I discussed my concern for cervical incompetence especially in the setting of 2 previous 19 and 18-week losses.  I discussed that a cerclage is the mainstay for treatment for this condition and will give the pregnancy the highest chance of success.  The patient verbalized understanding.  We discussed the risks and benefits of the procedure as well as the alternative of continuing vaginal progesterone  which is not recommended in the setting of cervical dilation.  I discussed the risks of the procedure including the risk to anesthesia, damage to blood vessels nerves and organs in the  operative field, bleeding and infection as well as premature rupture membrane and pregnancy loss.  I emphasized that these risks are considerably less then the chance of delivering a nonviable fetus due to cervical insufficiency.  The patient stated she understood and request to proceed with the procedure.  I offered for to have it scheduled today but the patient preferred next week.  I discussed I do not think it is likely that a few days will make a difference and we will schedule her procedure for Monday afternoon.  I have called the operating room to have this scheduled.  The patient stated she understood and all of her questions were answered.  I emphasized that she needs to be at the hospital 2 hours before her procedure and cannot eat or drink anything for 8 hours before that procedure.  The patient had time to ask questions that were answered to her satisfaction.  She verbalized understanding and agrees to proceed with the plan below.  Sonographic findings Single intrauterine pregnancy at 21w 6d  Fetal cardiac activity:  Observed and appears normal. Presentation: Breech. The anatomic structures that were well seen appear normal. Due to poor acoustic windows some structures remain suboptimally visualized. Fetal biometry shows the estimated fetal weight at the  percentile.  Amniotic fluid: Within normal limits.  MVP: 5.17 cm. Placenta: Anterior Fundal. Adnexa: No adnexal mass visualized. Cervical length: While the cervix is long minute the cervix approximates and there is fluid all within the cervical canal  and  therefore there is no measurable amount of cervix.  There are limitations of prenatal ultrasound such as the inability to detect certain abnormalities due to poor visualization. Various factors such as fetal position, gestational age and maternal body habitus may increase the difficulty in visualizing the fetal anatomy.    Recommendations - Cerclage for 10/27 at 330 pm - TVUS 2 weeks  from now to assess the cerclage  Review of Systems: A review of systems was performed and was negative except per HPI   Vitals and Physical Exam    11/08/2023   10:46 AM 10/30/2023   11:29 AM 10/22/2023    8:19 AM  Vitals with BMI  Weight  263 lbs   Systolic 115 124 97  Diastolic 59 64 63  Pulse 88 80     Sitting comfortably on the sonogram table Nonlabored breathing Normal rate and rhythm Abdomen is nontender  Past pregnancies OB History  Gravida Para Term Preterm AB Living  4 1 0 0 2 0  SAB IAB Ectopic Multiple Live Births  2 0 0 0 0    # Outcome Date GA Lbr Len/2nd Weight Sex Type Anes PTL Lv  4 Current           3 SAB 06/01/23 [redacted]w[redacted]d         2 SAB 2023 [redacted]w[redacted]d    SAB     1 Para 07/08/20 [redacted]w[redacted]d / 00:06 6.9 oz (0.196 kg) M Vag-Breech None  FD     I spent 30 minutes reviewing the patients chart, including labs and images as well as counseling the patient about her medical conditions. Greater than 50% of the time was spent in direct face-to-face patient counseling.  Delora Smaller  MFM, [facility]   11/08/2023  12:44 PM

## 2023-11-10 NOTE — Anesthesia Preprocedure Evaluation (Signed)
 Anesthesia Evaluation  Patient identified by MRN, date of birth, ID band Patient awake    Reviewed: Allergy & Precautions, NPO status , Patient's Chart, lab work & pertinent test results  Airway Mallampati: II  TM Distance: >3 FB Neck ROM: Full    Dental no notable dental hx. (+) Teeth Intact, Dental Advisory Given   Pulmonary neg pulmonary ROS   Pulmonary exam normal breath sounds clear to auscultation       Cardiovascular negative cardio ROS Normal cardiovascular exam Rhythm:Regular Rate:Normal     Neuro/Psych   Anxiety Depression    negative neurological ROS     GI/Hepatic negative GI ROS, Neg liver ROS,,,  Endo/Other  negative endocrine ROS    Renal/GU negative Renal ROS     Musculoskeletal   Abdominal   Peds  Hematology   Anesthesia Other Findings   Reproductive/Obstetrics (+) Pregnancy                              Anesthesia Physical Anesthesia Plan  ASA: 3  Anesthesia Plan: Spinal   Post-op Pain Management: Regional block*   Induction:   PONV Risk Score and Plan: Treatment may vary due to age or medical condition and Ondansetron   Airway Management Planned: Natural Airway and Nasal Cannula  Additional Equipment: None  Intra-op Plan:   Post-operative Plan:   Informed Consent: I have reviewed the patients History and Physical, chart, labs and discussed the procedure including the risks, benefits and alternatives for the proposed anesthesia with the patient or authorized representative who has indicated his/her understanding and acceptance.     Dental advisory given  Plan Discussed with: CRNA and Surgeon  Anesthesia Plan Comments: (22.2 wk G4P1 w hx incompetant cervix bmi 47 for cerclage under spinal)         Anesthesia Quick Evaluation

## 2023-11-11 ENCOUNTER — Other Ambulatory Visit: Payer: Self-pay | Admitting: Obstetrics and Gynecology

## 2023-11-11 ENCOUNTER — Encounter (HOSPITAL_COMMUNITY): Payer: Self-pay | Admitting: Maternal & Fetal Medicine

## 2023-11-11 ENCOUNTER — Other Ambulatory Visit: Payer: Self-pay

## 2023-11-11 ENCOUNTER — Encounter (HOSPITAL_COMMUNITY)
Admission: RE | Disposition: A | Discharge status: Home / Self Care | Payer: Self-pay | Source: Home / Self Care | Attending: Obstetrics and Gynecology

## 2023-11-11 ENCOUNTER — Inpatient Hospital Stay (HOSPITAL_COMMUNITY): Payer: Self-pay | Admitting: Anesthesiology

## 2023-11-11 ENCOUNTER — Observation Stay (HOSPITAL_COMMUNITY)
Admission: RE | Admit: 2023-11-11 | Discharge: 2023-11-11 | Disposition: A | Discharge status: Home / Self Care | Attending: Obstetrics and Gynecology | Admitting: Obstetrics and Gynecology

## 2023-11-11 DIAGNOSIS — F418 Other specified anxiety disorders: Secondary | ICD-10-CM | POA: Diagnosis not present

## 2023-11-11 DIAGNOSIS — Z6841 Body Mass Index (BMI) 40.0 and over, adult: Secondary | ICD-10-CM | POA: Diagnosis not present

## 2023-11-11 DIAGNOSIS — O3432 Maternal care for cervical incompetence, second trimester: Secondary | ICD-10-CM | POA: Diagnosis not present

## 2023-11-11 DIAGNOSIS — Z3A22 22 weeks gestation of pregnancy: Secondary | ICD-10-CM | POA: Insufficient documentation

## 2023-11-11 DIAGNOSIS — O09299 Supervision of pregnancy with other poor reproductive or obstetric history, unspecified trimester: Secondary | ICD-10-CM

## 2023-11-11 DIAGNOSIS — O0990 Supervision of high risk pregnancy, unspecified, unspecified trimester: Principal | ICD-10-CM

## 2023-11-11 HISTORY — PX: CERVICAL CERCLAGE: SHX1329

## 2023-11-11 LAB — CBC
HCT: 38.4 % (ref 36.0–46.0)
Hemoglobin: 12.1 g/dL (ref 12.0–15.0)
MCH: 25.5 pg — ABNORMAL LOW (ref 26.0–34.0)
MCHC: 31.5 g/dL (ref 30.0–36.0)
MCV: 80.8 fL (ref 80.0–100.0)
Platelets: 208 K/uL (ref 150–400)
RBC: 4.75 MIL/uL (ref 3.87–5.11)
RDW: 15.6 % — ABNORMAL HIGH (ref 11.5–15.5)
WBC: 6.6 K/uL (ref 4.0–10.5)
nRBC: 0 % (ref 0.0–0.2)

## 2023-11-11 LAB — TYPE AND SCREEN
ABO/RH(D): A POS
Antibody Screen: NEGATIVE

## 2023-11-11 SURGERY — CERCLAGE, CERVIX, VAGINAL APPROACH
Anesthesia: Spinal | Site: Vagina

## 2023-11-11 MED ORDER — STERILE WATER FOR IRRIGATION IR SOLN
Status: DC | PRN
  Administered 2023-11-11: 1000 mL

## 2023-11-11 MED ORDER — FENTANYL CITRATE (PF) 100 MCG/2ML IJ SOLN
INTRAMUSCULAR | Status: DC | PRN
  Administered 2023-11-11 (×2): 25 ug via INTRAVENOUS

## 2023-11-11 MED ORDER — ACETAMINOPHEN 160 MG/5ML PO SOLN: 325.0000 mg | ORAL | Status: DC | PRN

## 2023-11-11 MED ORDER — FENTANYL CITRATE (PF) 100 MCG/2ML IJ SOLN
INTRAMUSCULAR | Status: AC
  Filled 2023-11-11: qty 2

## 2023-11-11 MED ORDER — KETOROLAC TROMETHAMINE 30 MG/ML IJ SOLN
30.0000 mg | Freq: Once | INTRAMUSCULAR | Status: AC | PRN
  Administered 2023-11-11: 30 mg via INTRAVENOUS

## 2023-11-11 MED ORDER — ACETAMINOPHEN 325 MG PO TABS: 325.0000 mg | ORAL_TABLET | ORAL | Status: DC | PRN

## 2023-11-11 MED ORDER — BUPIVACAINE IN DEXTROSE 0.75-8.25 % IT SOLN
INTRATHECAL | Status: DC | PRN
  Administered 2023-11-11: 1 mL via INTRATHECAL

## 2023-11-11 MED ORDER — OXYCODONE-ACETAMINOPHEN 5-325 MG PO TABS: 1.0000 | ORAL_TABLET | ORAL | 0 refills | Status: AC | PRN

## 2023-11-11 MED ORDER — KETOROLAC TROMETHAMINE 30 MG/ML IJ SOLN
INTRAMUSCULAR | Status: AC
  Filled 2023-11-11: qty 1

## 2023-11-11 MED ORDER — POVIDONE-IODINE 10 % EX SWAB: 2.0000 | Freq: Once | CUTANEOUS | Status: DC

## 2023-11-11 MED ORDER — FENTANYL CITRATE (PF) 100 MCG/2ML IJ SOLN
25.0000 ug | INTRAMUSCULAR | Status: DC | PRN
  Administered 2023-11-11 (×3): 50 ug via INTRAVENOUS

## 2023-11-11 MED ORDER — LACTATED RINGERS IV SOLN: INTRAVENOUS | Status: DC

## 2023-11-11 MED ORDER — INDOMETHACIN 25 MG PO CAPS: 25.0000 mg | ORAL_CAPSULE | Freq: Four times a day (QID) | ORAL | 0 refills | Status: AC

## 2023-11-11 MED ORDER — CEFAZOLIN SODIUM-DEXTROSE 2-4 GM/100ML-% IV SOLN: 2.0000 g | INTRAVENOUS | Status: DC

## 2023-11-11 MED ORDER — ONDANSETRON HCL 4 MG/2ML IJ SOLN: 4.0000 mg | Freq: Once | INTRAMUSCULAR | Status: DC | PRN

## 2023-11-11 MED ORDER — ONDANSETRON HCL 4 MG/2ML IJ SOLN
INTRAMUSCULAR | Status: DC | PRN
  Administered 2023-11-11: 4 mg via INTRAVENOUS

## 2023-11-11 MED ORDER — INDOMETHACIN 25 MG PO CAPS: 25.0000 mg | ORAL_CAPSULE | Freq: Four times a day (QID) | ORAL | 0 refills | Status: DC

## 2023-11-11 SURGICAL SUPPLY — 16 items
CANISTER SUCT 3000ML PPV (MISCELLANEOUS) ×2 IMPLANT
ELECTRODE REM PT RTRN 9FT ADLT (ELECTROSURGICAL) ×2 IMPLANT
GLOVE BIO SURGEON STRL SZ7 (GLOVE) ×2 IMPLANT
GLOVE BIO SURGEON STRL SZ7.5 (GLOVE) ×2 IMPLANT
GOWN STRL REUS W/ TWL LRG LVL3 (GOWN DISPOSABLE) ×2 IMPLANT
HIBICLENS CHG 4% 4OZ BTL (MISCELLANEOUS) ×2 IMPLANT
MAT PREVALON FULL STRYKER (MISCELLANEOUS) IMPLANT
PACK VAGINAL MINOR WOMEN LF (CUSTOM PROCEDURE TRAY) ×2 IMPLANT
PAD OB MATERNITY 4.3X12.25 (PERSONAL CARE ITEMS) ×2 IMPLANT
PAD PREP 24X48 CUFFED NSTRL (MISCELLANEOUS) ×2 IMPLANT
PENCIL BUTTON HOLSTER BLD 10FT (ELECTRODE) ×2 IMPLANT
SUT MERSILENE FIBER S 5 MO-4 1 (SUTURE) ×2 IMPLANT
TOWEL OR 17X24 6PK STRL BLUE (TOWEL DISPOSABLE) ×2 IMPLANT
TRAY FOLEY W/BAG SLVR 14FR (SET/KITS/TRAYS/PACK) ×2 IMPLANT
TUBING NON-CON 1/4 X 20 CONN (TUBING) ×2 IMPLANT
YANKAUER SUCT BULB TIP NO VENT (SUCTIONS) ×2 IMPLANT

## 2023-11-11 NOTE — Discharge Summary (Signed)
 Gynecology Physician Postoperative Discharge Summary  Patient ID: Angela Hammond MRN: [id number] DOB/AGE: 1995-03-16 28 y.o.  Admit Date: 11/11/2023 Discharge Date: 11/11/2023  Preoperative Diagnoses: [redacted]w[redacted]d Incompetent cervix  Procedures: Procedure(s) (LRB): CERCLAGE, CERVIX, VAGINAL APPROACH (N/A)  Hospital Course:  Angela Hammond is a 28 y.o. [phone]  admitted for scheduled surgery.  She underwent the procedures as mentioned above, her operation was uncomplicated. For further details about surgery, please refer to the operative report. Patient had an uncomplicated postoperative course. By time of discharge on POD#0, her pain was controlled on oral pain medications; she was ambulating, voiding without difficulty, tolerating regular diet and passing flatus. She was deemed stable for discharge to home.   Significant Labs:    Latest Ref Rng & Units 11/11/2023    1:36 PM 08/16/2023   10:22 AM 08/02/2023    1:01 PM  CBC  WBC 4.0 - 10.5 K/uL 6.6  5.2  3.5   Hemoglobin 12.0 - 15.0 g/dL 87.8  87.9  88.1   Hematocrit 36.0 - 46.0 % 38.4  39.3  36.7   Platelets 150 - 400 K/uL 208  229  181     Discharge Exam: Blood pressure (!) 100/58, pulse 74, temperature 97.8 F (36.6 C), temperature source Oral, resp. rate (!) 22, height 5' 3 (1.6 m), weight 119.7 kg, SpO2 97%, unknown if currently breastfeeding. General appearance: alert and no distress  Resp: clear to auscultation bilaterally  Cardio: regular rate and rhythm  GI: soft, non-tender; bowel sounds normal; no masses, no organomegaly.  Incision: C/D/I, no erythema, no drainage noted Pelvic: scant blood on pad  Extremities: extremities normal, atraumatic, no cyanosis or edema and Homans sign is negative, no sign of DVT  Discharged Condition: Stable  Disposition: Discharge disposition: 01-Home or Self Care       Discharge Instructions     Discharge patient   Complete by: As directed    As per PACU criteria   Discharge  disposition: 01-Home or Self Care   Discharge patient date: 11/11/2023      Allergies as of 11/11/2023   No Known Allergies      Medication List     TAKE these medications    acetaminophen  500 MG tablet Commonly known as: TYLENOL  Take 500 mg by mouth every 6 (six) hours as needed.   acetaminophen -caffeine  500-65 MG Tabs per tablet Commonly known as: EXCEDRIN TENSION HEADACHE Take 2 tablets by mouth 4 (four) times daily as needed (Foe headaches).   Blood Pressure Kit Devi 1 each by Does not apply route as needed.   indomethacin 25 MG capsule Commonly known as: INDOCIN Take 1 capsule (25 mg total) by mouth every 6 (six) hours for 4 doses.   ondansetron  4 MG tablet Commonly known as: Zofran  Take 2 tablets (8 mg total) by mouth 2 (two) times daily.   oxyCODONE -acetaminophen  5-325 MG tablet Commonly known as: PERCOCET/ROXICET Take 1 tablet by mouth every 4 (four) hours as needed for severe pain (pain score 7-10).   progesterone  200 MG Supp Place 1 suppository (200 mg total) vaginally at bedtime.   Vitafol  Gummies 3.33-0.333-34.8 MG Chew Chew 3 tablets by mouth daily.        Follow-up Information     [facility] Follow up on 11/20/2023.   Specialty: Obstetrics and Gynecology Contact information: [address] [place]  [address] [phone]  Signed: Eveline Lynwood MATSU, MD, FACOG Obstetrician & Gynecologist Faculty Practice, [facility]

## 2023-11-11 NOTE — H&P (Signed)
 MFM H&P Note Patient Name: Angela Hammond  Patient MRN:   [id number]  Referring provider: [facility]   HPI: Angela Hammond is a 28 y.o. G4P0020 at [redacted]w[redacted]d in the PACU for a scheduled cerclage due to cervical dilation in the setting of hx of PTBD x2. I counseled her in the office on 10/24. She denies any new concerns and states she requests to proceed with the cerclage. I discussed the rare chance of abortion the procedure if the membranes were advanced well beyond the cervix but that this is unlikely. The patient had time to ask questions that were answered to her satisfaction. She verbalized understanding and agrees to proceed with the plan below.   Review of Systems: A review of systems was performed and was negative except per HPI   Past Obstetrical History:  OB History  Gravida Para Term Preterm AB Living  4 1 0 0 2 0  SAB IAB Ectopic Multiple Live Births  2 0 0 0 0    # Outcome Date GA Lbr Len/2nd Weight Sex Type Anes PTL Lv  4 Current           3 SAB 06/01/23 [redacted]w[redacted]d         2 SAB 2023 [redacted]w[redacted]d    SAB     1 Para 07/08/20 [redacted]w[redacted]d / 00:06 196 g M Vag-Breech None  FD     Past Gynecologic History:  Not discussed  Past Medical History:  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   Pelvic inflammatory disease (PID) 04/18/2011   Preterm labor in second trimester 08/31/2021   Previable preterm premature rupture of membranes at [redacted] weeks gestation 07/08/2020      Past Surgical History:    Past Surgical History:  Procedure Laterality Date   TONSILLECTOMY     AGE 55   WISDOM TOOTH EXTRACTION Left      Family History:   family history includes Diabetes in her maternal aunt; Heart failure in her father;  Hypertension in her father, maternal aunt, maternal grandfather, and mother; Stroke in her maternal aunt, maternal grandfather, and mother.   Social History:   Social History   Socioeconomic History   Marital status: Single    Spouse name: Not on file   Number of children: Not on file   Years of education: Not on file   Highest education level: Not on file  Occupational History   Not on file  Tobacco Use   Smoking status: Never   Smokeless tobacco: Never   Tobacco comments:    Black & mild  Vaping Use   Vaping status: Never Used  Substance and Sexual Activity   Alcohol use: Not Currently    Comment: little   Drug use: No   Sexual activity: Not Currently    Partners: Male    Birth control/protection: None    Comment: pregnant  Other Topics Concern   Not on file  Social History Narrative   Not on file   Social Drivers of Health   Financial Resource Strain: Not on file  Food Insecurity: Medium Risk (06/20/2022)   Received from [facility]  Hunger Vital Sign    Within the past 12 months, you worried that your food would run out before you got money to buy more: Sometimes true    Within the past 12 months, the food you bought just didn't last and you didn't have money to get more. : Sometimes true  Transportation Needs: No Transportation Needs (06/20/2022)   Received from [facility]    In the past 12 months, has lack of reliable transportation kept you from medical appointments, meetings, work or from getting things needed for daily living? : No  Physical Activity: Not on file  Stress: Not on file  Social Connections: Not on file  Intimate Partner Violence: Not on file      Home Medications:   No current facility-administered medications on file prior to encounter.   Current Outpatient Medications on File Prior to Encounter  Medication Sig Dispense Refill   acetaminophen  (TYLENOL ) 500 MG tablet Take 500 mg by mouth every 6 (six) hours as needed.      Prenatal Vit-Fe Phos-FA-Omega (VITAFOL  GUMMIES) 3.33-0.333-34.8 MG CHEW Chew 3 tablets by mouth daily. 90 tablet 5   progesterone  200 MG SUPP Place 1 suppository (200 mg total) vaginally at bedtime. 30 suppository 3   acetaminophen -caffeine  (EXCEDRIN TENSION HEADACHE) 500-65 MG TABS per tablet Take 2 tablets by mouth 4 (four) times daily as needed (Foe headaches). (Patient not taking: No sig reported) 60 tablet 0   Blood Pressure Monitoring (BLOOD PRESSURE KIT) DEVI 1 each by Does not apply route as needed. 1 each 0   ondansetron  (ZOFRAN ) 4 MG tablet Take 2 tablets (8 mg total) by mouth 2 (two) times daily. (Patient not taking: No sig reported) 20 tablet 0   [DISCONTINUED] dicyclomine  (BENTYL ) 20 MG tablet Take 1 tablet (20 mg total) by mouth 2 (two) times daily as needed for spasms. 20 tablet 0   [DISCONTINUED] fluticasone  (FLONASE ) 50 MCG/ACT nasal spray Place 1 spray into both nostrils daily. 5 g 0      Allergies:   No Known Allergies   Physical Exam:   Vitals:   11/11/23 1403  BP: 124/82  Pulse: 84  Temp: 98.2 F (36.8 C)  SpO2: 98%   Sitting comfortably on the hospital bed  Nonlabored breathing Normal rate and rhythm Abdomen is nontender FHT 152bpm  Assessment  Angela Hammond is a 29 y.o. G4P0020 at [redacted]w[redacted]d here for:   History of incompetent cervix, currently pregnant - Plan: CBC, Type and screen [facility], CBC, Type and screen [facility]  Recommendations - F/u as scheduled for 11/5 - 24h of Indocin - D/c today if the procedure is uncomplicated  Approximately 60 minutes was spent in chart review, communication with other providers, patient assessment and education and documentation. Thank you for the opportunity to be involved with this patient's care. Please let us  know if we can be of any further assistance.   Delora Smaller, DO Maternal fetal medicine, [place]   11/11/2023  3:30 PM

## 2023-11-11 NOTE — Op Note (Signed)
 Procedure(s): CERCLAGE, CERVIX, VAGINAL APPROACH Procedure Note  Angela Hammond female 28 y.o. 11/11/2023  Procedure(s) and Anesthesia Type:    * CERCLAGE, CERVIX, VAGINAL APPROACH - Spinal  Surgeons and Role:    DEWAINE William Glenn, DO - Primary    * Barbra Lang PARAS, DO - Assisting   Indications: The patient      Surgeon: Glenn William   Assistants: Jacob Stinson, DO  Anesthesia: Spinal anesthesia  Procedure Detail  After obtaining informed consent the patient was taken to the operative room and spinal anesthesia was established.  The patient was prepped and draped in the appropriate sterile fashion for a vaginal procedure.  She was placed in the lithotomy position with legs supported by stirrups.  A timeout was performed to confirm the patient's name, date of birth and the procedure.   A weighted speculum was placed into the vagina in addition to a right-angle retractor to visualize the cervix.  The cervix appeared dilated to 1cm cm.  The cervix was grasped with 2 ring forceps and caudad traction was placed on the cervix.  The Bovie was used to dissect the portiovaginalis at the cervicovaginal junction.  Blunt dissection was used to push the bladder cephalad.  Mersilene suture was used in a pursestring fashion at the level of the internal os.  The suture was then tied down and the strings were cut and left long for visualization.  The cervix was checked and was noted to be closed.  Pressure was applied over the portion of the cervix that was incised to create the bladder deflection.  Hemostasis was noted.  A rectal exam confirmed there was no suture present.  The patient tolerated the procedure well and was sent to PACU in satisfactory condition.   All needles, sponges and devices were counted and noted to be correct x2.    CERCLAGE, CERVIX, VAGINAL APPROACH  Findings: The cervix appeared parous, dilated to 1 cm.  Membranes were at the internal os.  Estimated Blood Loss:   Minimal         Drains: None         Total IV Fluids: See anesthesia report  Blood Given: none          Specimens: None         Implants: None        Complications: None         Disposition: PACU - hemodynamically stable.         Condition: stable

## 2023-11-11 NOTE — Anesthesia Procedure Notes (Signed)
 Spinal  Patient location during procedure: OB Start time: 11/11/2023 3:35 PM End time: 11/11/2023 3:41 PM Reason for block: surgical anesthesia Staffing Performed: anesthesiologist  Anesthesiologist: Jefm Garnette LABOR, MD Performed by: Jefm Garnette LABOR, MD Authorized by: Jefm Garnette LABOR, MD   Preanesthetic Checklist Completed: patient identified, IV checked, risks and benefits discussed, surgical consent, monitors and equipment checked, pre-op evaluation and timeout performed Spinal Block Patient position: sitting Prep: DuraPrep and site prepped and draped Patient monitoring: heart rate, cardiac monitor, continuous pulse ox and blood pressure Approach: midline Location: L3-4 Injection technique: single-shot Needle Needle type: Pencan  Needle gauge: 24 G Needle length: 10 cm Needle insertion depth: 7 cm Assessment Sensory level: T4 Events: CSF return Additional Notes  1Attempt (s). Pt tolerated procedure well.

## 2023-11-11 NOTE — Transfer of Care (Signed)
 Immediate Anesthesia Transfer of Care Note  Patient: Angela Hammond  Procedure(s) Performed: CERCLAGE, CERVIX, VAGINAL APPROACH (Vagina )  Patient Location: PACU  Anesthesia Type:Spinal  Level of Consciousness: awake, alert , and oriented  Airway & Oxygen Therapy: Patient Spontanous Breathing  Post-op Assessment: Report given to RN and Post -op Vital signs reviewed and stable  Post vital signs: Reviewed and stable  Last Vitals:  Vitals Value Taken Time  BP 111/59 11/11/23 17:31  Temp 36.6 C 11/11/23 17:15  Pulse 75 11/11/23 17:46  Resp 24 11/11/23 17:46  SpO2 96 % 11/11/23 17:46  Vitals shown include unfiled device data.  Last Pain:  Vitals:   11/11/23 1745  TempSrc:   PainSc: 8          Complications: No notable events documented.

## 2023-11-12 ENCOUNTER — Encounter (HOSPITAL_COMMUNITY): Payer: Self-pay | Admitting: Maternal & Fetal Medicine

## 2023-11-13 NOTE — Anesthesia Postprocedure Evaluation (Signed)
 Anesthesia Post Note  Patient: Angela Hammond  Procedure(s) Performed: CERCLAGE, CERVIX, VAGINAL APPROACH (Vagina )     Patient location during evaluation: Mother Baby Anesthesia Type: Spinal Level of consciousness: oriented and awake and alert Pain management: pain level controlled Vital Signs Assessment: post-procedure vital signs reviewed and stable Respiratory status: spontaneous breathing and respiratory function stable Cardiovascular status: blood pressure returned to baseline and stable Postop Assessment: no headache, no backache, no apparent nausea or vomiting and able to ambulate Anesthetic complications: no   No notable events documented.  Last Vitals:  Vitals:   11/11/23 1948 11/11/23 2000  BP: (!) 100/58   Pulse: 73 74  Resp: (!) 22 (!) 22  Temp:    SpO2: 97% 97%    Last Pain:  Vitals:   11/11/23 1942  TempSrc:   PainSc: 8    Pain Goal:                   [name]

## 2023-11-13 NOTE — Anesthesia Postprocedure Evaluation (Signed)
 Anesthesia Post Note  Patient: Angela Hammond  Procedure(s) Performed: CERCLAGE, CERVIX, VAGINAL APPROACH (Vagina )     Patient location during evaluation: L&D Anesthesia Type: Spinal Level of consciousness: oriented and awake and alert Pain management: pain level controlled Vital Signs Assessment: post-procedure vital signs reviewed and stable Respiratory status: spontaneous breathing, respiratory function stable and patient connected to nasal cannula oxygen Cardiovascular status: blood pressure returned to baseline and stable Postop Assessment: no headache, no backache and no apparent nausea or vomiting Anesthetic complications: no   No notable events documented.  Last Vitals:  Vitals:   11/11/23 1948 11/11/23 2000  BP: (!) 100/58   Pulse: 73 74  Resp: (!) 22 (!) 22  Temp:    SpO2: 97% 97%    Last Pain:  Vitals:   11/11/23 1942  TempSrc:   PainSc: 8    Pain Goal:                   [name]

## 2023-11-20 ENCOUNTER — Other Ambulatory Visit: Payer: Self-pay | Admitting: *Deleted

## 2023-11-20 ENCOUNTER — Ambulatory Visit (HOSPITAL_BASED_OUTPATIENT_CLINIC_OR_DEPARTMENT_OTHER)

## 2023-11-20 ENCOUNTER — Ambulatory Visit: Attending: Obstetrics | Admitting: Obstetrics

## 2023-11-20 ENCOUNTER — Encounter: Admitting: Obstetrics and Gynecology

## 2023-11-20 VITALS — BP 108/68 | HR 87

## 2023-11-20 DIAGNOSIS — O358XX0 Maternal care for other (suspected) fetal abnormality and damage, not applicable or unspecified: Secondary | ICD-10-CM

## 2023-11-20 DIAGNOSIS — E669 Obesity, unspecified: Secondary | ICD-10-CM

## 2023-11-20 DIAGNOSIS — O09292 Supervision of pregnancy with other poor reproductive or obstetric history, second trimester: Secondary | ICD-10-CM | POA: Diagnosis not present

## 2023-11-20 DIAGNOSIS — Z3A23 23 weeks gestation of pregnancy: Secondary | ICD-10-CM | POA: Diagnosis not present

## 2023-11-20 DIAGNOSIS — Z148 Genetic carrier of other disease: Secondary | ICD-10-CM | POA: Diagnosis not present

## 2023-11-20 DIAGNOSIS — O0990 Supervision of high risk pregnancy, unspecified, unspecified trimester: Secondary | ICD-10-CM | POA: Diagnosis not present

## 2023-11-20 DIAGNOSIS — O3432 Maternal care for cervical incompetence, second trimester: Secondary | ICD-10-CM | POA: Insufficient documentation

## 2023-11-20 DIAGNOSIS — O26872 Cervical shortening, second trimester: Secondary | ICD-10-CM

## 2023-11-20 DIAGNOSIS — O365920 Maternal care for other known or suspected poor fetal growth, second trimester, not applicable or unspecified: Secondary | ICD-10-CM | POA: Insufficient documentation

## 2023-11-20 DIAGNOSIS — Z3686 Encounter for antenatal screening for cervical length: Secondary | ICD-10-CM | POA: Insufficient documentation

## 2023-11-20 DIAGNOSIS — O09299 Supervision of pregnancy with other poor reproductive or obstetric history, unspecified trimester: Secondary | ICD-10-CM | POA: Diagnosis not present

## 2023-11-20 DIAGNOSIS — O99212 Obesity complicating pregnancy, second trimester: Secondary | ICD-10-CM | POA: Insufficient documentation

## 2023-11-20 NOTE — Progress Notes (Signed)
 MFM Consult Note  Angela Hammond is currently at 23 weeks and 4 days.  She has been followed due to maternal obesity with a BMI of 42 and cervical funneling.    She had a cervical cerclage placed last week and reports that she has been doing well following the procedure with only a small amount of brown vaginal discharge.  She denies feeling any contractions or lower abdominal cramping.    Sonographic findings Single intrauterine pregnancy at 23w 4d.  Fetal cardiac activity:  Observed and appears normal. Presentation: Breech. Interval fetal anatomy appears normal. Fetal biometry shows the estimated fetal weight of 1 pound 5 ounces (590 g) which measures at the 33rd percentile. Amniotic fluid volume: Within normal limits. MVP: 5.42 cm. Placenta: Anterior.  The patient was informed that anomalies may be missed due to technical limitations. If the fetus is in a suboptimal position or maternal habitus is increased, visualization of the fetus in the maternal uterus may be impaired.  Prior poor obstetrical history with 2 prior midtrimester losses A transvaginal ultrasound performed today showed a cervical length of 2.2 cm long with funneling.  The cervical cerclage stitch was visualized.  There is funneling of her cervix up to her cerclage. The patient was advised that hopefully, the cerclage will prolong her pregnancy until she reaches a more optimal gestational age.   Pelvic rest was advised. Preterm labor precautions were reviewed.  Obesity in pregnancy Due to maternal obesity, we will continue to follow her with growth ultrasounds throughout her pregnancy.   Weekly fetal testing should be started at 34 weeks and continued until delivery.    A follow-up growth scan was scheduled in 4 weeks.  The patient stated that all of her questions were answered today.  A total of 20 minutes was spent counseling and coordinating the care for this patient.  Greater than 50% of the time was spent in  direct face-to-face contact.

## 2023-12-02 ENCOUNTER — Other Ambulatory Visit: Payer: Self-pay

## 2023-12-02 DIAGNOSIS — Z3A25 25 weeks gestation of pregnancy: Secondary | ICD-10-CM

## 2023-12-02 NOTE — Progress Notes (Unsigned)
 PRENATAL VISIT NOTE  Subjective:  Angela Hammond is a 28 y.o. G4P0020 at [redacted]w[redacted]d being seen today for ongoing prenatal care.  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  Patient reports {sx:14538}.   .  .   . Denies leaking of fluid.   The following portions of the patient's history were reviewed and updated as appropriate: allergies, current medications, past family history, past medical history, past social history, past surgical history and problem list.   Objective:   There were no vitals filed for this visit.  Fetal Status:           General: Alert, oriented and cooperative. Patient is in no acute distress.  Skin: Skin is warm and dry. No rash noted.   Cardiovascular: Normal heart rate noted  Respiratory: Normal respiratory effort, no problems with respiration noted  Abdomen: Soft, gravid, appropriate for gestational age.        Pelvic: Cervical exam deferred        Extremities: Normal range of motion.     Mental Status: Normal mood and affect. Normal behavior. Normal judgment and thought content.      08/16/2023   11:38 AM 07/24/2021   10:04 AM 08/08/2020    8:39 AM  Depression screen PHQ 2/9  Decreased Interest 1 2 2   Down, Depressed, Hopeless 1 1 1   PHQ - 2 Score 2 3 3   Altered sleeping 2 2 2   Tired, decreased energy 1 1 2   Change in appetite 0  0  Feeling bad or failure about yourself  0 2 2  Trouble concentrating 0 2 0  Moving slowly or fidgety/restless 0 0 2  Suicidal thoughts 0 0 0  PHQ-9 Score 5  10  11       Data saved with a previous flowsheet row definition        08/16/2023   11:38 AM 07/24/2021   10:04 AM 08/08/2020     8:39 AM  GAD 7 : Generalized Anxiety Score  Nervous, Anxious, on Edge 1 1 2   Control/stop worrying 1 1 1   Worry too much - different things 1 1 1   Trouble relaxing 1 1 2   Restless 0 2 2  Easily annoyed or irritable 1 1 1   Afraid - awful might happen 2 1 2   Total GAD 7 Score 7 8 11     Assessment and Plan:  Pregnancy: G4P0020 at [redacted]w[redacted]d 1. Supervision of high risk pregnancy, antepartum (Primary) ***  2. [redacted] weeks gestation of pregnancy ***  3. History of incompetent cervix, currently pregnant - Cerclage placed 11/11/23. On vaginal progesterone .   4. History of miscarriage ***  Preterm labor symptoms and general obstetric precautions including but not limited to vaginal bleeding, contractions, leaking of fluid and fetal movement were reviewed in detail with the patient. Please refer to After Visit Summary for other counseling recommendations.   No follow-ups on file.  Future Appointments  Date Time Provider Department Center  12/03/2023  9:00 AM [facility] LAB [facility]  12/03/2023  9:35 AM Regino Camie DELENA EDDY [facility]  12/24/2023  1:15 PM [facility] PROVIDER 1 [facility]  12/24/2023  1:30 PM [facility] US4 [facility]    Camie DELENA Regino, CNM

## 2023-12-03 ENCOUNTER — Other Ambulatory Visit: Payer: Self-pay

## 2023-12-03 ENCOUNTER — Ambulatory Visit (INDEPENDENT_AMBULATORY_CARE_PROVIDER_SITE_OTHER): Admitting: Certified Nurse Midwife

## 2023-12-03 ENCOUNTER — Other Ambulatory Visit

## 2023-12-03 VITALS — BP 117/62 | HR 98 | Wt 276.0 lb

## 2023-12-03 DIAGNOSIS — O09292 Supervision of pregnancy with other poor reproductive or obstetric history, second trimester: Secondary | ICD-10-CM

## 2023-12-03 DIAGNOSIS — Z3A25 25 weeks gestation of pregnancy: Secondary | ICD-10-CM | POA: Diagnosis not present

## 2023-12-03 DIAGNOSIS — Z8759 Personal history of other complications of pregnancy, childbirth and the puerperium: Secondary | ICD-10-CM

## 2023-12-03 DIAGNOSIS — O0992 Supervision of high risk pregnancy, unspecified, second trimester: Secondary | ICD-10-CM | POA: Diagnosis not present

## 2023-12-03 DIAGNOSIS — O0990 Supervision of high risk pregnancy, unspecified, unspecified trimester: Secondary | ICD-10-CM

## 2023-12-03 DIAGNOSIS — O09299 Supervision of pregnancy with other poor reproductive or obstetric history, unspecified trimester: Secondary | ICD-10-CM

## 2023-12-04 ENCOUNTER — Other Ambulatory Visit

## 2023-12-04 ENCOUNTER — Encounter: Admitting: Obstetrics and Gynecology

## 2023-12-04 ENCOUNTER — Ambulatory Visit: Payer: Self-pay | Admitting: Certified Nurse Midwife

## 2023-12-04 DIAGNOSIS — O99012 Anemia complicating pregnancy, second trimester: Secondary | ICD-10-CM

## 2023-12-04 LAB — CBC
Hematocrit: 35.6 % (ref 34.0–46.6)
Hemoglobin: 10.9 g/dL — ABNORMAL LOW (ref 11.1–15.9)
MCH: 25.5 pg — ABNORMAL LOW (ref 26.6–33.0)
MCHC: 30.6 g/dL — ABNORMAL LOW (ref 31.5–35.7)
MCV: 83 fL (ref 79–97)
Platelets: 219 x10E3/uL (ref 150–450)
RBC: 4.27 x10E6/uL (ref 3.77–5.28)
RDW: 14.8 % (ref 11.7–15.4)
WBC: 5.9 x10E3/uL (ref 3.4–10.8)

## 2023-12-04 LAB — GLUCOSE TOLERANCE, 2 HOURS W/ 1HR
Glucose, 1 hour: 93 mg/dL (ref 70–179)
Glucose, 2 hour: 78 mg/dL (ref 70–152)
Glucose, Fasting: 77 mg/dL (ref 70–91)

## 2023-12-04 LAB — HIV ANTIBODY (ROUTINE TESTING W REFLEX): HIV Screen 4th Generation wRfx: NONREACTIVE

## 2023-12-04 LAB — RPR: RPR Ser Ql: NONREACTIVE

## 2023-12-04 MED ORDER — ACCRUFER 30 MG PO CAPS: 1.0000 | ORAL_CAPSULE | Freq: Every day | ORAL | 5 refills | Status: AC

## 2023-12-04 NOTE — Progress Notes (Signed)
 Patient with normal 3T labs overall, slight anemia noted with hemoglobin of 10.9. Message sent to patient, to inform of results and plan.   1. Anemia affecting pregnancy in second trimester (Primary) - Ferric Maltol (ACCRUFER) 30 MG CAPS; Take 1 capsule (30 mg total) by mouth daily.  Dispense: 30 capsule; Refill: 5  Camie Rote, MSN, CNM, RNC-OB Certified Nurse Midwife, [facility] 12/04/2023 10:25 AM

## 2023-12-11 DIAGNOSIS — O3432 Maternal care for cervical incompetence, second trimester: Secondary | ICD-10-CM | POA: Insufficient documentation

## 2023-12-24 ENCOUNTER — Ambulatory Visit

## 2023-12-24 ENCOUNTER — Other Ambulatory Visit: Payer: Self-pay | Admitting: *Deleted

## 2023-12-24 ENCOUNTER — Ambulatory Visit: Attending: Obstetrics | Admitting: Obstetrics and Gynecology

## 2023-12-24 ENCOUNTER — Other Ambulatory Visit: Payer: Self-pay | Admitting: Obstetrics

## 2023-12-24 VITALS — BP 122/60 | HR 79

## 2023-12-24 DIAGNOSIS — O3432 Maternal care for cervical incompetence, second trimester: Secondary | ICD-10-CM

## 2023-12-24 DIAGNOSIS — O365931 Maternal care for other known or suspected poor fetal growth, third trimester, fetus 1: Secondary | ICD-10-CM

## 2023-12-24 DIAGNOSIS — Z3A28 28 weeks gestation of pregnancy: Secondary | ICD-10-CM

## 2023-12-24 DIAGNOSIS — O3433 Maternal care for cervical incompetence, third trimester: Secondary | ICD-10-CM

## 2023-12-24 DIAGNOSIS — O99213 Obesity complicating pregnancy, third trimester: Secondary | ICD-10-CM

## 2023-12-24 DIAGNOSIS — O365930 Maternal care for other known or suspected poor fetal growth, third trimester, not applicable or unspecified: Secondary | ICD-10-CM

## 2023-12-24 NOTE — Progress Notes (Signed)
 Maternal-Fetal Medicine Consultation  Name: Angela Hammond  MRN: [id number]  GA: [id number] [redacted]w[redacted]d   Patient is here for fetal growth assessment.  Previously seen fetal growth restriction had resolved.  She had rescue cerclage in this pregnancy.  Ultrasound Estimated fetal weight and the abdominal circumference measurements are at the fifth percentiles.  Normal amniotic fluid.  Umbilical artery Doppler showed normal for diastolic flow. Fetal growth restriction I explained the finding of fetal growth restriction that is difficult to differentiate from a constitutionally small for gestational age fetus. Most fetuses are small but not growth restricted.  I discussed the possible causes of fetal growth restriction including placental insufficiency (most common cause), chromosomal anomalies and fetal infections.  Patient did not give history of fever or rashes.   I discussed ultrasound protocol of monitoring fetal growth restriction. Timing of delivery will be based on future fetal growth assessments and antenatal testing.  Recommendations - UA Doppler in 2 weeks and fetal growth assessment in 3 weeks.      Consultation including face-to-face (more than 50%) counseling 20 minutes.

## 2023-12-31 ENCOUNTER — Encounter: Admitting: Advanced Practice Midwife

## 2024-01-01 ENCOUNTER — Other Ambulatory Visit: Payer: Self-pay | Admitting: *Deleted

## 2024-01-01 DIAGNOSIS — O3432 Maternal care for cervical incompetence, second trimester: Secondary | ICD-10-CM

## 2024-01-01 DIAGNOSIS — O365990 Maternal care for other known or suspected poor fetal growth, unspecified trimester, not applicable or unspecified: Secondary | ICD-10-CM

## 2024-01-02 ENCOUNTER — Encounter: Payer: Self-pay | Admitting: Advanced Practice Midwife

## 2024-01-02 ENCOUNTER — Ambulatory Visit: Admitting: Advanced Practice Midwife

## 2024-01-02 ENCOUNTER — Other Ambulatory Visit: Payer: Self-pay

## 2024-01-02 ENCOUNTER — Other Ambulatory Visit (HOSPITAL_COMMUNITY)
Admission: RE | Admit: 2024-01-02 | Discharge: 2024-01-02 | Disposition: A | Source: Ambulatory Visit | Attending: Advanced Practice Midwife | Admitting: Advanced Practice Midwife

## 2024-01-02 VITALS — BP 111/75 | HR 92 | Wt 287.3 lb

## 2024-01-02 DIAGNOSIS — R109 Unspecified abdominal pain: Secondary | ICD-10-CM

## 2024-01-02 DIAGNOSIS — O26853 Spotting complicating pregnancy, third trimester: Secondary | ICD-10-CM | POA: Diagnosis not present

## 2024-01-02 DIAGNOSIS — Z23 Encounter for immunization: Secondary | ICD-10-CM

## 2024-01-02 DIAGNOSIS — O0990 Supervision of high risk pregnancy, unspecified, unspecified trimester: Secondary | ICD-10-CM | POA: Diagnosis present

## 2024-01-02 DIAGNOSIS — O3433 Maternal care for cervical incompetence, third trimester: Secondary | ICD-10-CM

## 2024-01-02 DIAGNOSIS — O365930 Maternal care for other known or suspected poor fetal growth, third trimester, not applicable or unspecified: Secondary | ICD-10-CM | POA: Diagnosis not present

## 2024-01-02 DIAGNOSIS — O3432 Maternal care for cervical incompetence, second trimester: Secondary | ICD-10-CM | POA: Diagnosis present

## 2024-01-02 DIAGNOSIS — O0993 Supervision of high risk pregnancy, unspecified, third trimester: Secondary | ICD-10-CM

## 2024-01-02 DIAGNOSIS — Z148 Genetic carrier of other disease: Secondary | ICD-10-CM

## 2024-01-02 DIAGNOSIS — Z3A29 29 weeks gestation of pregnancy: Secondary | ICD-10-CM | POA: Diagnosis not present

## 2024-01-02 DIAGNOSIS — O365990 Maternal care for other known or suspected poor fetal growth, unspecified trimester, not applicable or unspecified: Secondary | ICD-10-CM | POA: Insufficient documentation

## 2024-01-02 DIAGNOSIS — D563 Thalassemia minor: Secondary | ICD-10-CM

## 2024-01-02 DIAGNOSIS — O26893 Other specified pregnancy related conditions, third trimester: Secondary | ICD-10-CM | POA: Diagnosis not present

## 2024-01-02 NOTE — Patient Instructions (Incomplete)
[email or web address]  

## 2024-01-02 NOTE — Progress Notes (Unsigned)
° °  PRENATAL VISIT NOTE  Subjective:  Angela Hammond is a 28 y.o. G4P0020 at [redacted]w[redacted]d being seen today for ongoing prenatal care.  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   Patient reports {sx:14538}.   .  .   . Denies leaking of fluid.   The following portions of the patient's history were reviewed and updated as appropriate: allergies, current medications, past family history, past medical history, past social history, past surgical history and problem list.   Objective:  There were no vitals filed for this visit.  Fetal Status:           General:  Alert, oriented and cooperative. Patient is in no acute distress.  Skin: Skin is warm and dry. No rash noted.   Cardiovascular: Normal heart rate noted  Respiratory: Normal respiratory effort, no problems with respiration noted  Abdomen: Soft, gravid, appropriate for gestational age.        Pelvic: {Blank single:19197::Cervical exam performed in the presence of a chaperone,Cervical exam deferred}        Extremities: Normal range of motion.     Mental Status: Normal mood and affect. Normal behavior. Normal judgment and thought content.   US  12/9: Est. FW: 998 gm 2 lb 3 oz 4.1 %  AC 5% Assessment and Plan:  Pregnancy: G4P0020 at [redacted]w[redacted]d 1. Cervical insufficiency during pregnancy in second trimester, antepartum (Primary)  2. Cervical cerclage suture present in second trimester  3. Alpha thalassemia silent carrier  4. 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 Supervision of high risk pregnancy, antepartum  6. 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 Please refer to After Visit Summary for other counseling recommendations.   No follow-ups on file.  Future Appointments  Date Time Provider Department Center  01/02/2024 11:15 AM Claudene Mohair , CNM [facility]  01/06/2024 11:15 AM [facility] PROVIDER 1 [facility]  01/06/2024 11:30 AM [facility] US3 [facility]  01/21/2024  8:15 AM Izell Harari, MD [facility]  02/04/2024  8:15 AM Regino Camie LABOR, CNM [facility]  02/18/2024  8:15 AM Nicholaus Burnard HERO, MD [facility]  02/25/2024  9:15 AM W[facility]-GENERAL 2 [facility]  03/03/2024 10:55 AM WMC-GENERAL 2 [facility]  03/09/2024  9:35 AM WMC-GENERAL 2 [facility]  03/16/2024  9:35 AM WMC-GENERAL 2 [facility]    Crissy Mccreadie  Claudene EDDY [facility] [employer]

## 2024-01-02 NOTE — Assessment & Plan Note (Signed)
Partner declined testing

## 2024-01-05 LAB — URINE CULTURE, OB REFLEX: Organism ID, Bacteria: NO GROWTH

## 2024-01-05 LAB — CULTURE, OB URINE

## 2024-01-06 ENCOUNTER — Ambulatory Visit: Admitting: Obstetrics and Gynecology

## 2024-01-06 ENCOUNTER — Other Ambulatory Visit: Payer: Self-pay | Admitting: *Deleted

## 2024-01-06 ENCOUNTER — Ambulatory Visit: Attending: Obstetrics and Gynecology

## 2024-01-06 ENCOUNTER — Encounter: Payer: Self-pay | Admitting: General Practice

## 2024-01-06 VITALS — BP 106/53 | HR 88

## 2024-01-06 DIAGNOSIS — O3432 Maternal care for cervical incompetence, second trimester: Secondary | ICD-10-CM | POA: Diagnosis not present

## 2024-01-06 DIAGNOSIS — O0990 Supervision of high risk pregnancy, unspecified, unspecified trimester: Secondary | ICD-10-CM | POA: Diagnosis present

## 2024-01-06 DIAGNOSIS — O99213 Obesity complicating pregnancy, third trimester: Secondary | ICD-10-CM | POA: Diagnosis not present

## 2024-01-06 DIAGNOSIS — O365990 Maternal care for other known or suspected poor fetal growth, unspecified trimester, not applicable or unspecified: Secondary | ICD-10-CM | POA: Diagnosis present

## 2024-01-06 DIAGNOSIS — O365930 Maternal care for other known or suspected poor fetal growth, third trimester, not applicable or unspecified: Secondary | ICD-10-CM | POA: Insufficient documentation

## 2024-01-06 DIAGNOSIS — E669 Obesity, unspecified: Secondary | ICD-10-CM

## 2024-01-06 DIAGNOSIS — Z3A30 30 weeks gestation of pregnancy: Secondary | ICD-10-CM | POA: Diagnosis not present

## 2024-01-06 DIAGNOSIS — O3433 Maternal care for cervical incompetence, third trimester: Secondary | ICD-10-CM | POA: Insufficient documentation

## 2024-01-06 DIAGNOSIS — D563 Thalassemia minor: Secondary | ICD-10-CM | POA: Diagnosis not present

## 2024-01-06 DIAGNOSIS — O99013 Anemia complicating pregnancy, third trimester: Secondary | ICD-10-CM

## 2024-01-06 LAB — CERVICOVAGINAL ANCILLARY ONLY
Bacterial Vaginitis (gardnerella): POSITIVE — AB
Candida Glabrata: NEGATIVE
Candida Vaginitis: NEGATIVE
Chlamydia: NEGATIVE
Comment: NEGATIVE
Comment: NEGATIVE
Comment: NEGATIVE
Comment: NEGATIVE
Comment: NEGATIVE
Comment: NORMAL
Neisseria Gonorrhea: NEGATIVE
Trichomonas: NEGATIVE

## 2024-01-06 NOTE — Progress Notes (Signed)
 Maternal-Fetal Medicine Consultation  Name: Angela Hammond  MRN: [id number]  GA: [id number] [redacted]w[redacted]d   Fetal growth restriction.  On ultrasound performed 2 weeks ago, the estimated fetal weight was at the 4th percentile and the abdominal circumference measurement at the 5th percentile. She had rescue cerclage in this pregnancy because of her pregnancy loss at [redacted] weeks gestation. Ultrasound Normal amniotic fluid.  Umbilical artery Doppler showed normal forward diastolic flow.  Umbilical artery Doppler showed normal forward diastolic flow.  BPP 8/8. I reassured the patient of her ultrasound findings.  I explained that timing of delivery will be based on future fetal growth assessments and antenatal testing.  We are likely to recommend timing of delivery after fetal growth assessment around [redacted] weeks gestation.  Recommendations - Fetal growth assessment next week.     Consultation including face-to-face (more than 50%) counseling 10 minutes.

## 2024-01-14 ENCOUNTER — Telehealth: Payer: Self-pay | Admitting: Family Medicine

## 2024-01-14 NOTE — Telephone Encounter (Signed)
 Patient is requesting a call back today. She needs to speak to someone about her prenatals.

## 2024-01-15 MED ORDER — PREPLUS 27-1 MG PO TABS: 1.0000 | ORAL_TABLET | Freq: Every day | ORAL | 11 refills | Status: AC

## 2024-01-15 NOTE — Telephone Encounter (Signed)
 Pt request to have pill form PNV's as she feels that the gummie form is working for her.  Pt e-prescribed pill form.    Angela Hertzberg, RN  01/15/24

## 2024-01-17 ENCOUNTER — Ambulatory Visit: Payer: Self-pay | Admitting: Advanced Practice Midwife

## 2024-01-17 ENCOUNTER — Other Ambulatory Visit: Payer: Self-pay | Admitting: Advanced Practice Midwife

## 2024-01-17 DIAGNOSIS — N760 Acute vaginitis: Secondary | ICD-10-CM

## 2024-01-17 MED ORDER — METRONIDAZOLE 0.75 % VA GEL: 1.0000 | Freq: Every day | VAGINAL | 1 refills | Status: DC

## 2024-01-17 MED ORDER — METRONIDAZOLE 500 MG PO TABS: 500.0000 mg | ORAL_TABLET | Freq: Two times a day (BID) | ORAL | 0 refills | Status: DC

## 2024-01-21 ENCOUNTER — Ambulatory Visit: Attending: Obstetrics and Gynecology

## 2024-01-21 ENCOUNTER — Encounter: Payer: Self-pay | Admitting: Nurse Practitioner

## 2024-01-21 ENCOUNTER — Ambulatory Visit: Payer: Self-pay | Admitting: Nurse Practitioner

## 2024-01-21 ENCOUNTER — Other Ambulatory Visit: Payer: Self-pay

## 2024-01-21 ENCOUNTER — Ambulatory Visit

## 2024-01-21 VITALS — BP 105/73 | HR 120 | Wt 286.0 lb

## 2024-01-21 DIAGNOSIS — E669 Obesity, unspecified: Secondary | ICD-10-CM | POA: Diagnosis not present

## 2024-01-21 DIAGNOSIS — O3433 Maternal care for cervical incompetence, third trimester: Secondary | ICD-10-CM

## 2024-01-21 DIAGNOSIS — O365930 Maternal care for other known or suspected poor fetal growth, third trimester, not applicable or unspecified: Secondary | ICD-10-CM

## 2024-01-21 DIAGNOSIS — O99213 Obesity complicating pregnancy, third trimester: Secondary | ICD-10-CM | POA: Diagnosis not present

## 2024-01-21 DIAGNOSIS — Z3A32 32 weeks gestation of pregnancy: Secondary | ICD-10-CM

## 2024-01-21 DIAGNOSIS — O365990 Maternal care for other known or suspected poor fetal growth, unspecified trimester, not applicable or unspecified: Secondary | ICD-10-CM | POA: Diagnosis present

## 2024-01-21 DIAGNOSIS — O99013 Anemia complicating pregnancy, third trimester: Secondary | ICD-10-CM

## 2024-01-21 DIAGNOSIS — O99012 Anemia complicating pregnancy, second trimester: Secondary | ICD-10-CM

## 2024-01-21 DIAGNOSIS — O0993 Supervision of high risk pregnancy, unspecified, third trimester: Secondary | ICD-10-CM

## 2024-01-21 DIAGNOSIS — D563 Thalassemia minor: Secondary | ICD-10-CM

## 2024-01-21 DIAGNOSIS — O09293 Supervision of pregnancy with other poor reproductive or obstetric history, third trimester: Secondary | ICD-10-CM

## 2024-01-21 DIAGNOSIS — Z2911 Encounter for prophylactic immunotherapy for respiratory syncytial virus (RSV): Secondary | ICD-10-CM

## 2024-01-21 DIAGNOSIS — O3432 Maternal care for cervical incompetence, second trimester: Secondary | ICD-10-CM

## 2024-01-21 DIAGNOSIS — O0990 Supervision of high risk pregnancy, unspecified, unspecified trimester: Secondary | ICD-10-CM

## 2024-01-21 NOTE — Progress Notes (Signed)
 "  PRENATAL VISIT NOTE  Subjective:  Angela Hammond is a 29 y.o. G4P0020 at [redacted]w[redacted]d being seen today for ongoing prenatal care.  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  Patient reports no complaints.  Contractions: Not present. Vag. Bleeding: None.  Movement: Present. Denies leaking of fluid.   The following portions of the patient's history were reviewed and updated as appropriate: allergies, current medications, past family history, past medical history, past social history, past surgical history and problem list.   Objective:   Vitals:   01/21/24 0826  BP: 105/73  Pulse: (!) 120  Weight: 286 lb (129.7 kg)    Fetal Status: Fetal Heart Rate (bpm): 160   Movement: Present     General:  Alert, oriented and cooperative. Patient is in no acute distress.  Skin: Skin is warm and dry. No rash noted.   Cardiovascular: Normal heart rate noted  Respiratory: Normal respiratory effort, no problems with respiration noted  Abdomen: Soft, gravid, appropriate for gestational age.  Pain/Pressure: Present (round ligament pain)     Pelvic: Cervical exam deferred        Extremities: Normal range of motion.  Edema: None  Mental Status: Normal mood and affect. Normal behavior. Normal judgment and thought content.   Assessment and Plan:  Pregnancy: G4P0020 at [redacted]w[redacted]d  1. Need for RSV vaccination  Received today   2. Cervical insufficiency during pregnancy in second trimester, antepartum Cerclage in situ  S/p rescue cerclage placed at 19 weeks On vag progesterone  with  compliance   3. Fetal growth restriction antepartum 12/24/23 EFW (Est. FW: 998 gm 2 lb 3 oz 4.1 %)    F/U Fetal growth scheduled  Followed by MFM with BPP's and UA Dopplers ( Normal on 01/06/24) Consideration for 35 week delivery based on 35 week Fetal growth scan scheduled 02/11/24   5. Morbid obesity (HCC) BMI 50  6. [redacted] weeks gestation of pregnancy FHR appropriate VSS  7. Supervision of high risk pregnancy, antepartum   8. Anemia affecting pregnancy in second trimester Hx: Alpha Thal silent carrier/SMA Carrier Hgb 10.9 at 28 weeks Recheck at 36 week visit Taking Accufer daily    Preterm labor symptoms and general obstetric precautions including but not limited to vaginal bleeding, contractions, leaking of fluid and fetal movement were reviewed in detail with the patient.  Please refer to After Visit Summary for other counseling recommendations.     Future Appointments  Date Time Provider Department Center  01/21/2024  3:15 PM [facility] PROVIDER 1 [facility]  01/21/2024  3:30 PM [facility] US1 [facility]  01/27/2024  7:15 AM [facility] PROVIDER 1 [facility]  01/27/2024  7:30 AM [facility] US3 [facility]  02/03/2024  9:15 AM [facility] PROVIDER 1 [facility]  02/03/2024  9:30 AM [facility]MFC US1 [facility]  02/04/2024  8:15 AM Regino, Camie DELENA HOWARD [facility]  02/18/2024  8:15 AM Nicholaus Burnard HERO, MD [facility]  02/25/2024  8:55 AM Magali Barkley CROME, MD [facility]  03/03/2024 10:55 AM Izell Harari, MD [facility]  03/09/2024  9:35 AM Delores Nidia CROME, FNP [facility]  03/16/2024  8:35 AM Ilean, Norleen GAILS,  MD [facility]    Angela DELENA Dalton, NP "

## 2024-01-21 NOTE — Progress Notes (Signed)
 Maternal-Fetal Medicine Consultation  Name: Angela Hammond  MRN: [id number]  GA: [id number] [redacted]w[redacted]d   Fetal growth restriction. At previous growth assessment 4 weeks ago, the estimated fetal weight was at the 4th percentile and the abdominal circumference measurement is at the 5th percentile. She does not have hypertension or diabetes.   Patient had rescue cerclage at 22-weeks' gestation.  Ultrasound The estimated fetal weight is at the 14th percentile and the abdominal circumference measurement is at the 11th percentile. Normal amniotic fluid. Umbilical artery Doppler study showed normal forward diastolic flow. BPP 8/8.  I reassured the patient of norma fetal growth seen on today's ultrasound. I counseled the patient that grade 3 obesity is an independent risk factor for perinatal mortality and that we recommend weekly antenatal testing from 34-weeks' gestation. Absolute risk is very small. Patient had questions about cerclage removal. I informed her that cerclage removal is an outpatient procedure and is removed at 37-weeks' gestation. If difficulty is encountered, it can be removed under epidural analgesia.  Recommendations -BPP in 2 weeks and then weekly till delivery. -Fetal growth in 3 weeks.     Consultation including face-to-face (more than 50%) counseling 20 minutes.

## 2024-01-27 ENCOUNTER — Other Ambulatory Visit

## 2024-01-27 ENCOUNTER — Ambulatory Visit

## 2024-01-27 DIAGNOSIS — O0990 Supervision of high risk pregnancy, unspecified, unspecified trimester: Secondary | ICD-10-CM

## 2024-02-03 ENCOUNTER — Ambulatory Visit: Attending: Obstetrics and Gynecology | Admitting: Obstetrics and Gynecology

## 2024-02-03 ENCOUNTER — Ambulatory Visit

## 2024-02-03 VITALS — BP 109/70 | HR 108

## 2024-02-03 DIAGNOSIS — O3433 Maternal care for cervical incompetence, third trimester: Secondary | ICD-10-CM

## 2024-02-03 DIAGNOSIS — O99213 Obesity complicating pregnancy, third trimester: Secondary | ICD-10-CM

## 2024-02-03 DIAGNOSIS — E669 Obesity, unspecified: Secondary | ICD-10-CM | POA: Diagnosis not present

## 2024-02-03 DIAGNOSIS — Z3A34 34 weeks gestation of pregnancy: Secondary | ICD-10-CM | POA: Diagnosis not present

## 2024-02-03 DIAGNOSIS — O0990 Supervision of high risk pregnancy, unspecified, unspecified trimester: Secondary | ICD-10-CM

## 2024-02-03 DIAGNOSIS — O365930 Maternal care for other known or suspected poor fetal growth, third trimester, not applicable or unspecified: Secondary | ICD-10-CM | POA: Diagnosis not present

## 2024-02-03 DIAGNOSIS — O09293 Supervision of pregnancy with other poor reproductive or obstetric history, third trimester: Secondary | ICD-10-CM

## 2024-02-03 DIAGNOSIS — O0993 Supervision of high risk pregnancy, unspecified, third trimester: Secondary | ICD-10-CM | POA: Diagnosis not present

## 2024-02-03 DIAGNOSIS — O365990 Maternal care for other known or suspected poor fetal growth, unspecified trimester, not applicable or unspecified: Secondary | ICD-10-CM

## 2024-02-03 NOTE — Progress Notes (Unsigned)
 "  PRENATAL VISIT NOTE  Subjective:  Angela Hammond is a 29 y.o. G4P0020 at [redacted]w[redacted]d being seen today for ongoing prenatal care.  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  Patient reports {sx:14538}.   .  .   . Denies leaking of fluid.   The following portions of the patient's history were reviewed and updated as appropriate: allergies, current medications, past family history, past medical history, past social history, past surgical history and problem list.   Objective:   There were no vitals filed for this visit.  Fetal Status:           General: Alert, oriented and cooperative. Patient is in no acute distress.  Skin: Skin is warm and dry. No rash noted.   Cardiovascular: Normal heart rate noted  Respiratory: Normal respiratory effort, no problems with respiration noted  Abdomen: Soft, gravid, appropriate for gestational age.        Pelvic: Cervical exam deferred        Extremities: Normal range of motion.     Mental Status: Normal mood and affect. Normal behavior. Normal judgment and thought content.      08/16/2023   11:38 AM 07/24/2021   10:04 AM 08/08/2020    8:39 AM  Depression screen PHQ 2/9  Decreased Interest 1 2 2   Down, Depressed, Hopeless 1 1 1   PHQ - 2 Score 2 3 3   Altered sleeping 2 2 2   Tired, decreased energy 1 1 2   Change in appetite 0  0  Feeling bad or failure about yourself  0 2 2  Trouble concentrating 0 2 0  Moving slowly or fidgety/restless 0 0 2  Suicidal thoughts 0 0 0  PHQ-9 Score 5  10  11       Data saved with a previous  flowsheet row definition        08/16/2023   11:38 AM 07/24/2021   10:04 AM 08/08/2020    8:39 AM  GAD 7 : Generalized Anxiety Score  Nervous, Anxious, on Edge 1  1  2    Control/stop worrying 1  1  1    Worry too much - different things 1  1  1    Trouble relaxing 1  1  2    Restless 0  2  2   Easily annoyed or irritable 1  1  1    Afraid - awful might happen 2  1  2    Total GAD 7 Score 7 8 11      Data saved with a previous flowsheet row definition    Assessment and Plan:  Pregnancy: G4P0020 at [redacted]w[redacted]d 1. Supervision of high risk pregnancy, antepartum (Primary) - Doing well, feeling regular and vigorous fetal movement   2. [redacted] weeks gestation of pregnancy - Routine PNC, anticipatory guidance.    3. Anemia affecting pregnancy in third trimester - On Accrufer   4. Alpha thalassemia silent carrier 5. 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 Cervical cerclage suture present in third trimester ***  Preterm labor symptoms and general obstetric precautions including but not limited to vaginal bleeding, contractions, leaking of fluid and fetal movement were reviewed in  detail with the patient. Please refer to After Visit Summary for other counseling recommendations.   Return in about 2 weeks (around 02/18/2024) for HROB with GBS.  Future Appointments  Date Time Provider Department Center  02/04/2024  8:15 AM Regino Camie DELENA EDDY [facility]  02/11/2024 10:15 AM [facility] PROVIDER 1 [facility]  02/11/2024 10:30 AM [facility] US2 [facility]  02/18/2024  8:15 AM Nicholaus Burnard HERO, MD [facility]  02/19/2024  7:15 AM [facility] PROVIDER 1 [facility]  02/19/2024  7:30 AM [facility] US3 [facility]  02/25/2024  8:55 AM Cashion, Barkley CROME, MD [facility]  03/03/2024 10:55 AM Izell Harari, MD [facility]  03/09/2024  9:35 AM Delores Nidia CROME, FNP [facility]  03/16/2024  8:35 AM Ilean, Norleen GAILS, MD [facility]    Camie DELENA Regino, CNM  "

## 2024-02-03 NOTE — Progress Notes (Signed)
 After review, MFM consult with provider is not indicated for today  Arna Ranks, MD 02/03/2024 10:26 AM  [facility]

## 2024-02-04 ENCOUNTER — Other Ambulatory Visit: Payer: Self-pay

## 2024-02-04 ENCOUNTER — Ambulatory Visit: Payer: Self-pay | Admitting: Certified Nurse Midwife

## 2024-02-04 ENCOUNTER — Telehealth: Payer: Self-pay | Admitting: Clinical

## 2024-02-04 VITALS — BP 111/76 | HR 93 | Wt 288.1 lb

## 2024-02-04 DIAGNOSIS — O99013 Anemia complicating pregnancy, third trimester: Secondary | ICD-10-CM | POA: Diagnosis not present

## 2024-02-04 DIAGNOSIS — Z3A34 34 weeks gestation of pregnancy: Secondary | ICD-10-CM | POA: Diagnosis not present

## 2024-02-04 DIAGNOSIS — O3433 Maternal care for cervical incompetence, third trimester: Secondary | ICD-10-CM

## 2024-02-04 DIAGNOSIS — D563 Thalassemia minor: Secondary | ICD-10-CM

## 2024-02-04 DIAGNOSIS — O0990 Supervision of high risk pregnancy, unspecified, unspecified trimester: Secondary | ICD-10-CM

## 2024-02-04 DIAGNOSIS — O0993 Supervision of high risk pregnancy, unspecified, third trimester: Secondary | ICD-10-CM

## 2024-02-04 DIAGNOSIS — Z638 Other specified problems related to primary support group: Secondary | ICD-10-CM

## 2024-02-04 DIAGNOSIS — Z148 Genetic carrier of other disease: Secondary | ICD-10-CM

## 2024-02-04 NOTE — Telephone Encounter (Signed)
Attempt call regarding referral; Left HIPPA-compliant message to call back Mikle Sternberg from [facility] at  [phone] (Ashelyn Mccravy's office).    

## 2024-02-06 ENCOUNTER — Ambulatory Visit: Payer: Self-pay | Admitting: Clinical

## 2024-02-06 DIAGNOSIS — Z658 Other specified problems related to psychosocial circumstances: Secondary | ICD-10-CM

## 2024-02-06 DIAGNOSIS — F4320 Adjustment disorder, unspecified: Secondary | ICD-10-CM

## 2024-02-06 NOTE — BH Specialist Note (Unsigned)
 "   Integrated Behavioral Health via Telemedicine Visit  02/06/2024 Angela Hammond [id number]  Number of Integrated Behavioral Health Clinician visits: 1- Initial Visit  Session Start time: [phone]   Session End time: 0944  Total time in minutes: 53   Referring Provider: Camie Rote, CNM Angela/Family location: [place]/Temporary stay with friend [name] Provider location: Center for [facility] at [facility]  All persons participating in visit: Angela Hammond and Angela Hammond  Types of Service: {CHL AMB TYPE OF SERVICE:[phone]}  I connected with Angela Hammond and/or Angela Hammond's {family members:20773} via  Telephone or Video Enabled Telemedicine Application  (Video is Caregility application) and verified that I am speaking with the correct person using two identifiers. Discussed confidentiality: {YES/NO:21197}  I discussed the limitations of telemedicine and the availability of in person appointments.  Discussed there is a possibility of technology failure and discussed alternative modes of communication if that failure occurs.  I discussed that engaging in this telemedicine visit, they consent to the provision of behavioral healthcare and the services will be billed under their insurance.  Angela and/or legal guardian expressed understanding and consented to Telemedicine visit: {YES/NO:21197}  Presenting Concerns: Angela and/or family reports the following symptoms/concerns: *** Duration of problem: ***; Severity of problem: {Mild/Moderate/Severe:20260}  Angela and/or Family's Strengths/Protective Factors: {CHL AMB BH PROTECTIVE FACTORS:[phone]}  Goals Addressed: Angela will:  Reduce symptoms of: {IBH Symptoms:21014056}   Increase knowledge and/or ability of: {IBH Angela Tools:21014057}   Demonstrate ability to: {IBH Goals:21014053}  Progress towards Goals: {CHL AMB BH PROGRESS TOWARDS  GOALS:[phone]}    Interventions: Interventions utilized:  {IBH Interventions:21014054} Standardized Assessments completed: {IBH Screening Tools:21014051}    Angela and/or Family Response: ***  Clinical Assessment/Diagnosis  Adjustment disorder, unspecified type  Psychosocial stressors    Assessment: Angela currently experiencing ***.   Angela may benefit from ***.  Plan: Follow up with behavioral health clinician on : *** Behavioral recommendations: *** Referral(s): {IBH Referrals:21014055}  I discussed the assessment and treatment plan with the Angela and/or parent/guardian. They were provided an opportunity to ask questions and all were answered. They agreed with the plan and demonstrated an understanding of the instructions.   They were advised to call back or seek an in-person evaluation if the symptoms worsen or if the condition fails to improve as anticipated.  Angela JAYSON Mering, LCSW     02/06/2024    9:23 AM 08/16/2023   11:38 AM 07/24/2021   10:04 AM 08/08/2020    8:39 AM  Depression screen PHQ 2/9  Decreased Interest 1 1 2 2   Down, Depressed, Hopeless 1 1 1 1   PHQ - 2 Score 2 2 3 3   Altered sleeping 1 2 2 2   Tired, decreased energy 0 1 1 2   Change in appetite 0 0  0  Feeling bad or failure about yourself  0 0 2 2  Trouble concentrating 1 0 2 0  Moving slowly or fidgety/restless 0 0 0 2  Suicidal thoughts 0 0 0 0  PHQ-9 Score 4 5  10  11       Data saved with a previous flowsheet row definition      02/06/2024    9:27 AM 08/16/2023   11:38 AM 07/24/2021   10:04 AM 08/08/2020    8:39 AM  GAD 7 : Generalized Anxiety Score  Nervous, Anxious, on Edge 1 1  1  2    Control/stop worrying 0 1  1  1  Worry too much - different things 1 1  1  1    Trouble relaxing 1 1  1  2    Restless 1 0  2  2   Easily annoyed or irritable 3 1  1  1    Afraid - awful might happen 1 2  1  2    Total GAD 7 Score 8 7 8 11      Data saved with a previous flowsheet row  definition          "

## 2024-02-06 NOTE — BH Specialist Note (Signed)
 Behavioral Health Treatment Plan  Name:Angela Hammond  [facility] [employer]  MRN: [id number]  Treatment Plan Development Date: 02/06/2024  Strengths: Family and Friends  Supports: Sister; Friend(s)  Theatre Manager of Needs: Paternity test in hospital; feeling safe/supported in delivery room; childcare after going back to work; permanent housing; reduce overall life stress to feel more manageable  Treatment Level: biweekly  Client Treatment Preferences:virtual  Diagnosis: Adjustment Disorder  Symptoms:  Specify whether:309.24 (F43.22) With anxiety: Nervousness, worry, jitteriness, or separation anxiety is predominant.  Goals:  Return to previous functioning and reduce or eliminate symptoms.  Objectives: Target Date For All Objectives: Three months postpartum  Enhance problem-solving abilities and develop skills to address and resolve challenges. and Stay connected with supportive friends and loved ones  Progress Documentation:   Progressing  Interventions:  Motivational Interviewing and Solution-Oriented/Positive Psychology    Expected duration of treatment: To three months postpartum  Party responsible for implementation of interventions: [facility].  This plan has been reviewed and created by the following participants: Gianne Shugars and Warren Mering   A new plan will be created at least every 12 months.  The patient fully participated in the development of treatment plan with the clinician and verbally consents to such treatment.   Patient Treatment Plan Signature Obtained:  Due to visit being conducted virtually, verbal consent for the treatment plan was obtained following review of the treatment plan, its intended use, and limitations.The patient and/or guardian demonstrated understanding and were agreeable to the plan.     Warren JAYSON Mering, LCSW     02/06/2024    9:23 AM 08/16/2023   11:38 AM 07/24/2021   10:04 AM 08/08/2020     8:39 AM  Depression screen PHQ 2/9  Decreased Interest 1 1 2 2   Down, Depressed, Hopeless 1 1 1 1   PHQ - 2 Score 2 2 3 3   Altered sleeping 1 2 2 2   Tired, decreased energy 0 1 1 2   Change in appetite 0 0  0  Feeling bad or failure about yourself  0 0 2 2  Trouble concentrating 1 0 2 0  Moving slowly or fidgety/restless 0 0 0 2  Suicidal thoughts 0 0 0 0  PHQ-9 Score 4 5  10  11       Data saved with a previous flowsheet row definition      02/06/2024    9:27 AM 08/16/2023   11:38 AM 07/24/2021   10:04 AM 08/08/2020    8:39 AM  GAD 7 : Generalized Anxiety Score  Nervous, Anxious, on Edge 1 1  1  2    Control/stop worrying 0 1  1  1    Worry too much - different things 1 1  1  1    Trouble relaxing 1 1  1  2    Restless 1 0  2  2   Easily annoyed or irritable 3 1  1  1    Afraid - awful might happen 1 2  1  2    Total GAD 7 Score 8 7 8 11      Data saved with a previous flowsheet row definition

## 2024-02-07 ENCOUNTER — Ambulatory Visit: Attending: Obstetrics and Gynecology

## 2024-02-07 DIAGNOSIS — O3432 Maternal care for cervical incompetence, second trimester: Secondary | ICD-10-CM | POA: Insufficient documentation

## 2024-02-07 DIAGNOSIS — Z3A34 34 weeks gestation of pregnancy: Secondary | ICD-10-CM | POA: Diagnosis not present

## 2024-02-07 DIAGNOSIS — O99213 Obesity complicating pregnancy, third trimester: Secondary | ICD-10-CM | POA: Diagnosis not present

## 2024-02-07 NOTE — Patient Instructions (Signed)
 Center for [facility] at [facility] for Women [address] [place], [place] [address] [phone] (main office) [phone] (Deneane Stifter's office)  [place] Child Counsellor  (Childcare options, Early childcare development, etc.) www.gu[place]children.org  [place]  Child Care Facility Search Engine  https://ncchildcare.http://cook.com/  Housing Resources                    [employer] (serves [place], [place], [place], [place], [place], [place], [place], [place], [place], [place], [place], [place], and [place] counties) [address], [place], [place] [address] [phone] developeru.ch  **Rental assistance, Home Rehabilitation,Weatherization Assistance Program, Chief Financial Officer, Housing Voucher Program  [employer] for Housing and [employer] Studies: Proofreader Resources to residents of [place], [place], and [place] Make sure you have your documents ready, including:  (Household income verification: 2 months pay stubs, unemployment/social security award letter, statement of no income for all household members over 108) Photo ID for all household members over 18 Utility Bill/Rent Ledger/Lease: must show past due amount for utilities/rent, or the rental agreement if rent is current 2. Start your application online or by paper (in English or Spanish) at:     [email or web address].[email or web address]  3. Once you have completed the online application, you will get an email confirmation message from the county. Expect to hear back by phone or email at least 6-10 weeks from submitting your application.  4. While you wait:  Call [phone] to check in on your application Let your landlord know that you've applied. Your landlord will be asked to submit documents (W-9) during this application process. Payments will be made  directly to the landlord/property management company and utility company Rent or utility assistance for [employer], [place], and [place] Apply at https://rb.gy/dvxbfv Questions? Call or email Renee at [phone] or [email or web address]   Eviction Mediation Program: The HOPE Program Https://www.rebuild.bedroomrental.com.cy HOPE Progam serves low-income renters in 22 [place]  counties, defined as less than or equal to 80% of the area median income for the county where the renter lives. In the following 12 counties, you should apply to your local rent and utility assistance program INSTEAD OF the HOPE Program: [place], [place], [place], [place], [place], [place], [place], [place], [place], [place], [place], [place]  If you live outside of [place], contact HOPE call center at [phone] to talk to a Program Representative Monday-Friday, 8am-5pm Note that Native American tribes also received federal funding for rent and utility assistance programs. Recognized members of the following tribes will be served by programs managed by tribal governments, including: [place] Indians, [place], [place] Indian Tribe, [place]  and [facility] Eviction Mediation Coordinator, [place], [phone] [email or web address] .[employer], [address], [phone] [email or web address]   Housing Resources [facility] [place] [address], [place], [place] [address] [phone] www.gha-[place].[facility] [address] [place], [place] [address] [phone] phonecaptions.ch **Programs include: Hospital Doctor and Housing Counseling, Healthy Radiographer, Therapeutic, Homeless Prevention and Housing Assistance  Government [facility] [address], [place], [place] [address] [phone] www.paintingemporium.co.za **housing  applications/recertification; tax payment relief/exemption under specific qualifications  [facility] [address] [place], [place] [address] www.online[place].com/~maryshouse **transitional housing for women in recovery who have minor children or are pregnant  [facility] [address] [place], [place], [place] [address] https://johnson-smith.net/  **emergency shelter and support services for families facing homelessness  [facility] [address], [place],  [place] [address] [phone] www.youthfocus.org **transitional housing to pregnant women; emergency housing for youth who have run away, are experiencing a family crisis, are victims of abuse or neglect, or are homeless  [facility] [address], [place], [place] [address] [phone] ircgso.org **Drop-in center for people experiencing homelessness; overnight warming center when temperature is 25 degrees or below  Re-Entry [employer] [address] [place], [place], [place] [address] [phone] https://reentrystaffingagency.org/ **help with affordable housing to people experiencing homelessness or unemployment due to incarceration  [facility] [address] [place], [place] [address] [phone] www.greensborourbanministry.org  **emergency and transitional housing, rent/mortgage assistance, utility assistance  [facility] [address], [place], [place] [address] [phone] www.salvationarmyof[place].org **emergency and transitional housing  [employer] for [employer] [address], [place], [place] [address] [phone] Www.habitatgreensboro.[facility]   [employer] [address], [place], [place] [address] [phone] https://chshousing.org **Home Ownership/Affordable Housing Program and [facility]  [employer] [address], [place], [place]  [address] [phone] paidvalue.com.cy **home buyer education courses, foreclosure prevention  [facility] [address], [place], [place] [address] [phone] defmagazine.is **Environmental Exposure Assessment (investigation of homes where either children or pregnant women with a confirmed elevated blood lead level reside)  [place]  Division of Vocational Rehabilitation-[place] [address] Genevia LABOR [place], [place] [address] [phone] showreturn.ca **Home Expense Assistance/Repairs Program; offers home accessibility updates, such as ramps or bars in the bathroom  [employer] [address] [place], [place] [address] [phone] https://www.self-help.org/locations/[place]-branch **Offers credit-building and banking services to people unable to use traditional banking

## 2024-02-07 NOTE — Procedures (Signed)
 Angela Hammond 1995/02/01 [redacted]w[redacted]d  Fetus A Non-Stress Test Interpretation for 02/07/24  Indication: Cervical Cerclage present - NST only  Fetal Heart Rate A Mode: External Baseline Rate (A): 145 bpm Variability: Moderate Accelerations: 15 x 15 Decelerations: None Multiple birth?: No  Uterine Activity Mode: Palpation, Toco Contraction Frequency (min): none noted Resting Tone Palpated: Relaxed  Interpretation (Fetal Testing) Nonstress Test Interpretation: Reactive Comments: Reviewed with Dr. Arna

## 2024-02-11 ENCOUNTER — Other Ambulatory Visit

## 2024-02-11 ENCOUNTER — Ambulatory Visit

## 2024-02-18 ENCOUNTER — Encounter: Payer: Self-pay | Admitting: Obstetrics and Gynecology

## 2024-02-19 ENCOUNTER — Ambulatory Visit

## 2024-02-19 ENCOUNTER — Ambulatory Visit (HOSPITAL_BASED_OUTPATIENT_CLINIC_OR_DEPARTMENT_OTHER): Admitting: Obstetrics

## 2024-02-19 VITALS — BP 112/59 | HR 78

## 2024-02-19 DIAGNOSIS — O99013 Anemia complicating pregnancy, third trimester: Secondary | ICD-10-CM | POA: Diagnosis not present

## 2024-02-19 DIAGNOSIS — Z3A36 36 weeks gestation of pregnancy: Secondary | ICD-10-CM

## 2024-02-19 DIAGNOSIS — E669 Obesity, unspecified: Secondary | ICD-10-CM | POA: Diagnosis not present

## 2024-02-19 DIAGNOSIS — O365990 Maternal care for other known or suspected poor fetal growth, unspecified trimester, not applicable or unspecified: Secondary | ICD-10-CM

## 2024-02-19 DIAGNOSIS — O99213 Obesity complicating pregnancy, third trimester: Secondary | ICD-10-CM

## 2024-02-19 DIAGNOSIS — O365930 Maternal care for other known or suspected poor fetal growth, third trimester, not applicable or unspecified: Secondary | ICD-10-CM

## 2024-02-19 DIAGNOSIS — O09299 Supervision of pregnancy with other poor reproductive or obstetric history, unspecified trimester: Secondary | ICD-10-CM

## 2024-02-19 DIAGNOSIS — O0990 Supervision of high risk pregnancy, unspecified, unspecified trimester: Secondary | ICD-10-CM | POA: Diagnosis not present

## 2024-02-19 DIAGNOSIS — D563 Thalassemia minor: Secondary | ICD-10-CM | POA: Diagnosis not present

## 2024-02-19 DIAGNOSIS — O3433 Maternal care for cervical incompetence, third trimester: Secondary | ICD-10-CM | POA: Diagnosis not present

## 2024-02-19 DIAGNOSIS — O3432 Maternal care for cervical incompetence, second trimester: Secondary | ICD-10-CM

## 2024-02-19 NOTE — Progress Notes (Signed)
 MFM Consult Note  Angela Hammond is currently at [redacted]w[redacted]d. She has been followed due to maternal obesity with a BMI of 42.  She currently has a cervical cerclage in place. IUGR was noted earlier in her pregnancy but has since resolved.  She denies any problems since her last exam and has screened negative for gestational diabetes.    Her blood pressure today was 112/59.  Sonographic findings Single intrauterine pregnancy at 36w 4d. Fetal cardiac activity: Observed. Presentation: Cephalic. Fetal biometry shows the estimated fetal weight of 5 lb 11 oz,  2594g (18%). Amniotic fluid: Within normal limits. AFI: 10.98 cm.  MVP: 5.83 cm. Placenta: Anterior. BPP: 8/8.   The patient should have her cerclage removed at around 37 weeks.    Due to her elevated BMI, we will continue to follow her with weekly fetal testing until delivery.    Delivery should occur at around 39 weeks should she remain undelivered at that time.    She will return in 1 week for an NST.  The patient stated that all of her questions were answered.   A total of 20 minutes was spent counseling and coordinating the care for this patient.  Greater than 50% of the time was spent in direct face-to-face contact.

## 2024-02-19 NOTE — BH Specialist Note (Unsigned)
 "   Integrated Behavioral Health via Telemedicine Visit  02/21/2024 Angela Hammond [id number]  Number of Integrated Behavioral Health Clinician visits: 2- Second Visit  Session Start time: 0920   Session End time: 0958  Total time in minutes: 38   Referring Provider: Camie Hammond, CNM Patient/Family location: Home [facility] [place] [facility] Provider location: [facility]  All persons participating in visit: [name] and [facility] Angela Hammond   Types of Service: Individual psychotherapy and Video visit  I connected with Angela Hammond and/or Angela Hammond's n/a via  Telephone or Video Enabled Telemedicine Application  (Video is Caregility application) and verified that I am speaking with the correct person using two identifiers. Discussed confidentiality: Yes   I discussed the limitations of telemedicine and the availability of in person appointments.  Discussed there is a possibility of technology failure and discussed alternative modes of communication if that failure occurs.  I discussed that engaging in this telemedicine visit, they consent to the provision of behavioral healthcare and the services will be billed under their insurance.  Patient and/or legal guardian expressed understanding and consented to Telemedicine visit: Yes   Presenting Concerns: Patient and/or family reports the following symptoms/concerns: Concern about recent stomach cramps and light spotting; reassured by most recent ultrasound; pt is focusing on maintaining peace and stability; healthy pregnancy and baby, instead of worry about baby's father who is not being supportive as originally anticipated. Pt has good support from her sister and cousin.  Duration of problem: Pregnancy; Severity of problem: moderate  Patient and/or Family's Strengths/Protective Factors: Social connections, Concrete supports in place (healthy food, safe environments, etc.), and Sense of  purpose  Goals Addressed: Patient will:  Reduce symptoms of: anxiety and stress   Increase knowledge and/or ability of: healthy habits and stress reduction   Demonstrate ability to: Increase healthy adjustment to current life circumstances and Increase motivation to adhere to plan of care  Progress towards Goals: Ongoing    Interventions: Interventions utilized:  Motivational Interviewing, Psychoeducation and/or Health Education, and Supportive Reflection Standardized Assessments completed: Not Needed    Patient and/or Family Response: Patient agrees with treatment plan.   Clinical Assessment/Diagnosis  Adjustment disorder with anxiety  Patient may benefit from continued therapeutic intervention.   Plan: Follow up with behavioral health clinician on : About two weeks postpartum Behavioral recommendations:  -Continue renewed focus on healthy self-care (regular meals/rest; practical support and time with supportive people in life; prioritizing peace and stability) -Consider taking childbirth class of choice, as needed, at www.conehealthybaby.com  Referral(s): Integrated Art Gallery Manager (In Clinic) and [employer] Resources:  childbirth class  I discussed the assessment and treatment plan with the patient and/or parent/guardian. They were provided an opportunity to ask questions and all were answered. They agreed with the plan and demonstrated an understanding of the instructions.   They were advised to call back or seek an in-person evaluation if the symptoms worsen or if the condition fails to improve as anticipated.  Angela JAYSON Mering, LCSW     02/06/2024    9:23 AM 08/16/2023   11:38 AM 07/24/2021   10:04 AM 08/08/2020    8:39 AM  Depression screen PHQ 2/9  Decreased Interest 1 1 2 2   Down, Depressed, Hopeless 1 1 1 1   PHQ - 2 Score 2 2 3 3   Altered sleeping 1 2 2 2   Tired, decreased energy 0 1 1 2   Change in appetite 0 0  0  Feeling bad or failure about  yourself  0 0 2 2  Trouble concentrating 1 0 2 0  Moving slowly or fidgety/restless 0 0 0 2  Suicidal thoughts 0 0 0 0  PHQ-9 Score 4 5  10  11       Data saved with a previous flowsheet row definition      02/06/2024    9:27 AM 08/16/2023   11:38 AM 07/24/2021   10:04 AM 08/08/2020    8:39 AM  GAD 7 : Generalized Anxiety Score  Nervous, Anxious, on Edge 1 1  1  2    Control/stop worrying 0 1  1  1    Worry too much - different things 1 1  1  1    Trouble relaxing 1 1  1  2    Restless 1 0  2  2   Easily annoyed or irritable 3 1  1  1    Afraid - awful might happen 1 2  1  2    Total GAD 7 Score 8 7 8 11      Data saved with a previous flowsheet row definition     "

## 2024-02-20 ENCOUNTER — Ambulatory Visit: Payer: Self-pay

## 2024-02-20 DIAGNOSIS — F4322 Adjustment disorder with anxiety: Secondary | ICD-10-CM

## 2024-02-20 NOTE — Patient Instructions (Signed)
 Center for [facility] [address] [place], [place] [address] [phone] (main office) [phone] (Ignacio Lowder's office)     BRAINSTORMING  Develop a Plan Goals: Provide a way to start conversation about your new life with a baby Assist parents in recognizing and using resources within their reach Help pave the way before birth for an easier period of transition afterwards.  Make a list of the following information to keep in a central location: Full name of Mom and Partner: _____________________________________________ Baby's full name and Date of Birth: ___________________________________________ Home Address: ___________________________________________________________ ________________________________________________________________________ Home Phone: ____________________________________________________________ Parents' cell numbers: _____________________________________________________ ________________________________________________________________________ Name and contact info for OB: ______________________________________________ Name and contact info for Pediatrician:________________________________________ Contact info for Lactation Consultants: ________________________________________  REST and SLEEP *You each need at least 4-5 hours of uninterrupted sleep every day. Write specific names and contact information.* How are you going to rest in the postpartum period? While partner's home? When partner returns to work? When you both return to work? Where will your baby sleep? Who is available to help during the day? Evening? Night? Who could move in for a period to help support you? What are some ideas to help you get enough  sleep? __________________________________________________________________________________________________________________________________________________________________________________________________________________________________________ NUTRITIOUS FOOD AND DRINK *Plan for meals before your baby is born so you can have healthy food to eat during the immediate postpartum period.* Who will look after breakfast? Lunch? Dinner? List names and contact information. Brainstorm quick, healthy ideas for each meal. What can you do before baby is born to prepare meals for the postpartum period? How can others help you with meals? Which grocery stores provide online shopping and delivery? Which restaurants offer take-out or delivery options? ______________________________________________________________________________________________________________________________________________________________________________________________________________________________________________________________________________________________________________________________________________________________________________________________________  CARE FOR MOM *It's important that mom is cared for and pampered in the postpartum period. Remember, the most important ways new mothers need care are: sleep, nutrition, gentle exercise, and time off.* Who can come take care of mom during this period? Make a list of people with their contact information. List some activities that make you feel cared for, rested, and energized? Who can make sure you have opportunities to do these things? Does mom have a space of her very own within your home that's just for her? Make a "[facility]" where she can be comfortable, rest, and renew herself  daily. ______________________________________________________________________________________________________________________________________________________________________________________________________________________________________________________________________________________________________________________________________________________________________________________________________    CARE FOR AND FEEDING BABY *Knowledgeable and encouraging people will offer the best support with regard to feeding your baby.* Educate yourself and choose the best feeding option for your baby. Make a list of people who will guide, support, and be a resource for you as your care for and feed your baby. (Friends that have breastfed or are currently breastfeeding, lactation consultants, breastfeeding support groups, etc.) Consider a postpartum doula. (These websites can give you information: dona.org & https://shea.org/) Seek out local breastfeeding resources like the breastfeeding support group at [employer] or [employer]. ______________________________________________________________________________________________________________________________________________________________________________________________________________________________________________________________________________________________________________________________________________________________________________________________________  Judson Roch AND ERRANDS Who can help with a thorough cleaning before baby is born? Make a list of people who will help with housekeeping and chores, like laundry, light cleaning, dishes, bathrooms, etc. Who can run some errands for you? What can you do to make sure you are stocked with basic supplies before baby is born? Who is going to do the  shopping? ______________________________________________________________________________________________________________________________________________________________________________________________________________________________________________________________________________________________________________________________________________________________________________________________________     Family Adjustment *Nurture yourselves.it helps parents be more loving and allows for better bonding with their child.* What sorts of things do you and partner enjoy  doing together? Which activities help you to connect and strengthen your relationship? Make a list of those things. Make a list of people whom you trust to care for your baby so you can have some time together as a couple. What types of things help partner feel connected to Mom? Make a list. What needs will partner have in order to bond with baby? Other children? Who will care for them when you go into labor and while you are in the hospital? Think about what the needs of your older children might be. Who can help you meet those needs? In what ways are you helping them prepare for bringing baby home? List some specific strategies you have for family adjustment. _______________________________________________________________________________________________________________________________________________________________________________________________________________________________________________________________________________________________________________________________________________  SUPPORT *Someone who can empathize with experiences normalizes your problems and makes them more bearable.* Make a list of other friends, neighbors, and/or co-workers you know with infants (and small children, if applicable) with whom you can connect. Make a list of local or online support groups, mom groups, etc. in which you can be  involved. ______________________________________________________________________________________________________________________________________________________________________________________________________________________________________________________________________________________________________________________________________________________________________________________________________  Childcare Plans Investigate and plan for childcare if mom is returning to work. Talk about mom's concerns about her transition back to work. Talk about partner's concerns regarding this transition.  Mental Health *Your mental health is one of the highest priorities for a pregnant or postpartum mom.* 1 in 5 women experience anxiety and/or depression from the time of conception through the first year after birth. Postpartum Mood Disorders are the #1 complication of pregnancy and childbirth and the suffering experienced by these mothers is not necessary! These illnesses are temporary and respond well to treatment, which often includes self-care, social support, talk therapy, and medication when needed. Women experiencing anxiety and depression often say things like: "I'm supposed to be happy.why do I feel so sad?", "Why can't I snap out of it?", "I'm having thoughts that scare me." There is no need to be embarrassed if you are feeling these symptoms: Overwhelmed, anxious, angry, sad, guilty, irritable, hopeless, exhausted but can't sleep You are NOT alone. You are NOT to blame. With help, you WILL be well. Where can I find help? Medical professionals such as your OB, midwife, gynecologist, family practitioner, primary care provider, pediatrician, or mental health providers; [facility] support groups: Feelings After Birth, Breastfeeding Support Group, Baby and Me Group, and Fit 4 Two exercise classes. You have permission to ask for help. It will confirm your feelings, validate your experiences,  share/learn coping strategies, and gain support and encouragement as you heal. You are important! BRAINSTORM Make a list of local resources, including resources for mom and for partner. Identify support groups. Identify people to call late at night - include names and contact info. Talk with partner about perinatal mood and anxiety disorders. Talk with your OB, midwife, and doula about baby blues and about perinatal mood and anxiety disorders. Talk with your pediatrician about perinatal mood and anxiety disorders.   Support & Sanity Savers   What do you really need?  Basics In preparing for a new baby, many expectant parents spend hours shopping for baby clothes, decorating the nursery, and deciding which car seat to buy. Yet most don't think much about what the reality of parenting a newborn will be like, and what they need to make it through that. So, here is the advice of experienced parents. We know you'll read this, and think "they're exaggerating, I don't really need that." Just trust [place] on these, OK? Plan for all of  this, and if it turns out you don't need it, come back and teach [place] how you did it!  Must-Haves (Once baby's survival needs are met, make sure you attend to your own survival needs!) Sleep An average newborn sleeps 16-18 hours per day, over 6-7 sleep periods, rarely more than three hours at a time. It is normal and healthy for a newborn to wake throughout the night... but really hard on parents!! Naps. Prioritize sleep above any responsibilities like: cleaning house, visiting friends, running errands, etc.  Sleep whenever baby sleeps. If you can't nap, at least have restful times when baby eats. The more rest you get, the more patient you will be, the more emotionally stable, and better at solving problems.  Food You may not have realized it would be difficult to eat when you have a newborn. Yet, when we talk to countless new parents, they say things like "it may be 2:00 pm  when I realize I haven't had breakfast yet." Or "every time we sit down to dinner, baby needs to eat, and my food gets cold, so I don't bother to eat it." Finger food. Before your baby is born, stock up with one months' worth of food that: 1) you can eat with one hand while holding a baby, 2) doesn't need to be prepped, 3) is good hot or cold, 4) doesn't spoil when left out for a few hours, and 5) you like to eat. Think about: nuts, dried fruit, Clif bars, pretzels, jerky, gogurt, baby carrots, apples, bananas, crackers, cheez-n-crackers, string cheese, hot pockets or frozen burritos to microwave, garden burgers and breakfast pastries to put in the toaster, yogurt drinks, etc. Restaurant Menus. Make lists of your favorite restaurants & menu items. When family/friends want to help, you can give specific information without much thought. They can either bring you the food or send gift cards for just the right meals. Freezer Meals.  Take some time to make a few meals to put in the freezer ahead of time.  Easy to freeze meals can be anything such as soup, lasagna, chicken pie, or spaghetti sauce. Set up a Meal Schedule.  Ask friends and family to sign up to bring you meals during the first few weeks of being home. (It can be passed around at baby showers!) You have no idea how helpful this will be until you are in the throes of parenting.  Machine[employer].it is a great website to check out. Emotional Support Know who to call when you're stressed out. Parenting a newborn is very challenging work. There are times when it totally overwhelms your normal coping abilities. EVERY NEW PARENT NEEDS TO HAVE A PLAN FOR WHO TO CALL WHEN THEY JUST CAN'T COPE ANY MORE. (And it has to be someone other than the baby's other parent!) Before your baby is born, come up with at least one person you can call for support - write their phone number down and post it on the refrigerator. Anxiety & Sadness. Baby blues are normal after  pregnancy; however, there are more severe types of anxiety & sadness which can occur and should not be ignored.  They are always treatable, but you have to take the first step by reaching out for help. [facility] offers a "Mom Talk" group which meets every Tuesday from 10 am - 11 am.  This group is for new moms who need support and connection after their babies are born.  Call [phone].  Really, Really Helpful (Plan for them!  Make sure these happen often!!) Physical Support with Taking Care of Yourselves Asking friends and family. Before your baby is born, set up a schedule of people who can come and visit and help out (or ask a friend to schedule for you). Any time someone says "let me know what I can do to help," sign them up for a day. When they get there, their job is not to take care of the baby (that's your job and your joy). Their job is to take care of you!  Postpartum doulas. If you don't have anyone you can call on for support, look into postpartum doulas:  professionals at helping parents with caring for baby, caring for themselves, getting breastfeeding started, and helping with household tasks. www.padanc.org is a helpful website for learning about doulas in our area. Peer Support / Parent Groups Why: One of the greatest ideas for new parents is to be around other new parents. Parent groups give you a chance to share and listen to others who are going through the same season of life, get a sense of what is normal infant development by watching several babies learn and grow, share your stories of triumph and struggles with empathetic ears, and forgive your own mistakes when you realize all parents are learning by trial and error. Where to find: There are many places you can meet other new parents throughout our community.  [facility] offers the following classes for new moms and their little ones:  Baby and Me (Birth to Crawling) and Breastfeeding Support Group. Go to  www.conehealthybaby.com or call [phone] for more information. Time for your Relationship It's easy to get so caught up in meeting baby's immediate needs that it's hard to find time to connect with your partner, and meet the needs of your relationship. It's also easy to forget what "quality time with your partner" actually looks like. If you take your baby on a date, you'd be amazed how much of your couple time is spent feeding the baby, diapering the baby, admiring the baby, and talking about the baby. Dating: Try to take time for just the two of you. Babysitter tip: Sometimes when moms are breastfeeding a newborn, they find it hard to figure out how to schedule outings around baby's unpredictable feeding schedules. Have the babysitter come for a three hour period. When she comes over, if baby has just eaten, you can leave right away, and come back in two hours. If baby hasn't fed recently, you start the date at home. Once baby gets hungry and gets a good feeding in, you can head out for the rest of your date time. Date Nights at Home: If you can't get out, at least set aside one evening a week to prioritize your relationship: whenever baby dozes off or doesn't have any immediate needs, spend a little time focusing on each other. Potential conflicts: The main relationship conflicts that come up for new parents are: issues related to sexuality, financial stresses, a feeling of an unfair division of household tasks, and conflicts in parenting styles. The more you can work on these issues before baby arrives, the better!  Fun and Frills (Don't forget these. and don't feel guilty for indulging in them!) Everyone has something in life that is a fun little treat that they do just for themselves. It may be: reading the morning paper, or going for a daily jog, or having coffee with a friend once a week, or going to a movie on Friday nights,  or fine chocolates, or bubble baths, or curling up with a good  book. Unless you do fun things for yourself every now and then, it's hard to have the energy for fun with your baby. Whatever your "special" treats are, make sure you find a way to continue to indulge in them after your baby is born. These special moments can recharge you, and allow you to return to baby with a new joy   PERINATAL MOOD DISORDERS: MATERNAL MENTAL HEALTH FROM CONCEPTION THROUGH THE POSTPARTUM PERIOD   _________________________________________Emergency and Crisis Resources If you are an imminent risk to self or others, are experiencing intense personal distress, and/or have noticed significant changes in activities of daily living, call:  [facility]: [phone]  [address] [place], [place], [address] Mobile Crisis: [phone] National Suicide Hotline: 22 Or visit the following crisis centers: Local Emergency Departments [facility]:  [address], [place]  Mon-Friday 8am-3pm, [phone]                                                                                  ___________ Non-Crisis Resources To identify specific providers that are covered by your insurance, contact your insurance company or local agencies:   Postpartum Support International- Warm-line: 629-532-6019                                                      __Outpatient Therapy and Medication Management   Providers:  [facility]: [phone] Hours: 9AM-5PM  Insurance Accepted: Pilar Jarvis, BCBS, Crista Luria, [place], Medicare  [facility]): [phone] Hours: 8AM-5:30PM  nsurance Accepted: All insurances EXCEPT AARP, [place], [place], and [employer] of the [place]: [phone] Hours: 8AM-8PM Insurance Accepted: Ulla Gallo, Crista Luria, [place], Medicare, [phone] [facility]: [phone] Hours: 8:30AM-7PM Insurance Accepted: Ulla Gallo, Medicaid, Medicare, Tricare, [employer] [facility]:  [phone]   Hours:9AM-5PM Insurance Accepted:  Monia Pouch, Ezequiel Essex, [employer], [place], [employer] [facility]  [facility]: [phone] Hours: 9AM-5:30PM Insurance Accepted: Pilar Jarvis, Teodoro Spray, and Medicaid, Medicare, [facility]:  [phone] Hours: 9am-5pm Insurance Accepted: BCBS; they do not accept Medicaid/Medicare [facility]: [phone] Hours: 9am-9pm Insurance Accepted: All major insurance including Medicaid and Medicare [facility]: [phone] Hours: 9AM- 5PM Insurance Accepted: All insurances EXCEPT Medicaid and Medicare. [facility]: [phone]   ____________                                                                     Parenting Support Groups [facility] at [facility] :  [address], [place], [place], [address] [phone] [facility]:  [phone] Family Support Network: (support for children in the NICU and/or with special needs), [phone]     _____________                                                                                  Online Resources Postpartum Support International: SeekAlumni.co.za  800-944-4PPD Supporting Moms:  www.momssupportingmoms.net

## 2024-02-25 ENCOUNTER — Encounter: Payer: Self-pay | Admitting: Obstetrics and Gynecology

## 2024-02-25 ENCOUNTER — Ambulatory Visit

## 2024-03-03 ENCOUNTER — Other Ambulatory Visit

## 2024-03-03 ENCOUNTER — Encounter: Payer: Self-pay | Admitting: Obstetrics and Gynecology

## 2024-03-09 ENCOUNTER — Encounter: Payer: Self-pay | Admitting: Obstetrics and Gynecology

## 2024-03-16 ENCOUNTER — Encounter: Payer: Self-pay | Admitting: Family Medicine
